# Patient Record
Sex: Female | Born: 1950 | Hispanic: Yes | Smoking: Former smoker
Health system: Southern US, Community
[De-identification: ages and names within clinical notes are randomized; demographics above are authoritative.]

## PROBLEM LIST (undated history)

## (undated) DIAGNOSIS — I10 Essential (primary) hypertension: Secondary | ICD-10-CM

## (undated) DIAGNOSIS — E079 Disorder of thyroid, unspecified: Secondary | ICD-10-CM

## (undated) HISTORY — PX: BREAST CYST ASPIRATION: SHX578

## (undated) HISTORY — PX: TONSILLECTOMY: SUR1361

## (undated) HISTORY — PX: APPENDECTOMY: SHX54

---

## 2018-06-09 ENCOUNTER — Emergency Department
Admission: EM | Admit: 2018-06-09 | Discharge: 2018-06-09 | Disposition: A | Discharge status: Home / Self Care | Payer: Self-pay | Source: Home / Self Care | Attending: Family Medicine | Admitting: Family Medicine

## 2018-06-09 ENCOUNTER — Other Ambulatory Visit: Payer: Self-pay

## 2018-06-09 ENCOUNTER — Encounter: Payer: Self-pay | Admitting: *Deleted

## 2018-06-09 DIAGNOSIS — E079 Disorder of thyroid, unspecified: Secondary | ICD-10-CM

## 2018-06-09 DIAGNOSIS — I10 Essential (primary) hypertension: Secondary | ICD-10-CM

## 2018-06-09 DIAGNOSIS — Z760 Encounter for issue of repeat prescription: Secondary | ICD-10-CM

## 2018-06-09 HISTORY — DX: Disorder of thyroid, unspecified: E07.9

## 2018-06-09 HISTORY — DX: Essential (primary) hypertension: I10

## 2018-06-09 LAB — POCT CBC W AUTO DIFF (K'VILLE URGENT CARE)

## 2018-06-09 MED ORDER — VALSARTAN-HYDROCHLOROTHIAZIDE 160-12.5 MG PO TABS: 1.0000 | ORAL_TABLET | Freq: Every day | ORAL | 0 refills | Status: DC

## 2018-06-09 MED ORDER — LEVOTHYROXINE SODIUM 125 MCG PO TABS: 125.0000 ug | ORAL_TABLET | Freq: Every day | ORAL | 0 refills | Status: DC

## 2018-06-09 NOTE — Discharge Instructions (Signed)
° °  Your thyroid and blood pressure medication were filled for 1 month as a courtesy in urgent care. Maintenance medication will not be refilled again as this urgent care. It is important to establish care with a primary care provider to ensure you are on the correct dose of medications and are not having any bad side effects from the prescriptions you are prescribed.   Please call to schedule an appointment with a primary care provider for ongoing healthcare needs including medication refills and monitoring of your thyroid disease and blood pressure.

## 2018-06-09 NOTE — ED Provider Notes (Signed)
[facility]    CSN: [id number] Arrival date & time: 06/09/18  1606     History   Chief Complaint Chief Complaint  Patient presents with  . Medication Refill    HPI Erica Russell is a 68 y.o. female.   HPI Erica Russell is a 68 y.o. female presenting to UC with hx of HTN and hypothyroidism requesting a one month refill of her medications. Pt moved to the area recently from [place] and does not have a PCP. Pt states she went to various Urgent Cares in [place].  She ran out of her thyroid medication about 1 week ago. She has started to feel fatigued and have eye pressure.  Denies chest pain, SOB, or palpitations. She notes she is on BP medication but the last provider wrote the prescription for medication that is double the dose she is normally on so she has been cutting her pills in half but states it is difficult to cut them equally.  She is suppose to be on valsartan-hydrochlorothiazide 120/12.5 but was written a prescription for 320/25mg  last time.   Past Medical History:  Diagnosis Date  . Hypertension   . Thyroid disease     There are no active problems to display for this patient.   Past Surgical History:  Procedure Laterality Date  . APPENDECTOMY    . TONSILLECTOMY      OB History   No obstetric history on file.      Home Medications    Prior to Admission medications   Medication Sig Start Date End Date Taking? Authorizing Provider  levothyroxine (SYNTHROID, LEVOTHROID) 125 MCG tablet Take 1 tablet (125 mcg total) by mouth daily. 06/09/18   Lurene Shadow, PA-C  valsartan-hydrochlorothiazide (DIOVAN HCT) 160-12.5 MG tablet Take 1 tablet by mouth daily. 06/09/18   Lurene Shadow, PA-C    Family History Family History  Problem Relation Age of Onset  . Hypertension Mother   . Diabetes Mother   . Hypertension Father     Social History Social History   Tobacco Use  . Smoking status: Former Games developer  . Smokeless tobacco: Never Used  Substance Use  Topics  . Alcohol use: Yes    Comment: occ  . Drug use: Never     Allergies   Patient has no known allergies.   Review of Systems Review of Systems  Constitutional: Positive for fatigue. Negative for unexpected weight change.  Respiratory: Negative for chest tightness and shortness of breath.   Cardiovascular: Negative for chest pain and palpitations.  Neurological: Positive for headaches (mild frontal). Negative for dizziness and light-headedness.     Physical Exam Triage Vital Signs ED Triage Vitals  Enc Vitals Group     BP 06/09/18 1645 (!) 144/99     Pulse Rate 06/09/18 1645 70     Resp 06/09/18 1645 16     Temp 06/09/18 1645 97.9 F (36.6 C)     Temp Source 06/09/18 1645 Oral     SpO2 06/09/18 1645 97 %     Weight 06/09/18 1647 183 lb (83 kg)     Height 06/09/18 1647 5\' 7"  (1.702 m)     Head Circumference --      Peak Flow --      Pain Score 06/09/18 1647 0     Pain Loc --      Pain Edu? --      Excl. in GC? --    No data found.  Updated Vital Signs BP Marland Kitchen)  144/99 (BP Location: Right Arm)   Pulse 70   Temp 97.9 F (36.6 C) (Oral)   Resp 16   Ht 5\' 7"  (1.702 m)   Wt 183 lb (83 kg)   SpO2 97%   BMI 28.66 kg/m   Visual Acuity Right Eye Distance:   Left Eye Distance:   Bilateral Distance:    Right Eye Near:   Left Eye Near:    Bilateral Near:     Physical Exam Vitals signs and nursing note reviewed.  Constitutional:      Appearance: Normal appearance. She is well-developed.  HENT:     Head: Normocephalic and atraumatic.     Nose: Nose normal.     Mouth/Throat:     Mouth: Mucous membranes are moist.  Eyes:     Pupils: Pupils are equal, round, and reactive to light.  Neck:     Musculoskeletal: Normal range of motion and neck supple.  Cardiovascular:     Rate and Rhythm: Normal rate and regular rhythm.  Pulmonary:     Effort: Pulmonary effort is normal.     Breath sounds: Normal breath sounds.  Musculoskeletal: Normal range of motion.    Skin:    General: Skin is warm and dry.  Neurological:     Mental Status: She is alert and oriented to person, place, and time.  Psychiatric:        Behavior: Behavior normal.      UC Treatments / Results  Labs (all labs ordered are listed, but only abnormal results are displayed) Labs Reviewed  COMPLETE METABOLIC PANEL WITH GFR  TSH  POCT CBC W AUTO DIFF ([facility])    EKG None  Radiology No results found.  Procedures Procedures (including critical care time)  Medications Ordered in UC Medications - No data to display  Initial Impression / Assessment and Plan / UC Course  I have reviewed the triage vital signs and the nursing notes.  Pertinent labs & imaging results that were available during my care of the patient were reviewed by me and considered in my medical decision making (see chart for details).     Pt appears well, NAD No records on file, will check her labs: CBC- unremarkable CMP and TSH- pending Will fill 1 month supply of maintenance medication. Resource guide for PCPs provided.  Final Clinical Impressions(s) / UC Diagnoses   Final diagnoses:  Encounter for medication refill  Thyroid disease  Essential hypertension, benign     Discharge Instructions       Your thyroid and blood pressure medication were filled for 1 month as a courtesy in urgent care. Maintenance medication will not be refilled again as this urgent care. It is important to establish care with a primary care provider to ensure you are on the correct dose of medications and are not having any bad side effects from the prescriptions you are prescribed.   Please call to schedule an appointment with a primary care provider for ongoing healthcare needs including medication refills and monitoring of your thyroid disease and blood pressure.    ED Prescriptions    Medication Sig Dispense Auth. Provider   levothyroxine (SYNTHROID, LEVOTHROID) 125 MCG tablet Take 1 tablet  (125 mcg total) by mouth daily. 30 tablet Doroteo GlassmanPhelps, Jodel Mayhall O, PA-C   valsartan-hydrochlorothiazide (DIOVAN HCT) 160-12.5 MG tablet Take 1 tablet by mouth daily. 30 tablet Lurene ShadowPhelps, Pang Robers O, PA-C     Controlled Substance Prescriptions  Controlled Substance Registry consulted? Not Applicable  Lurene Shadowhelps, Devyn Griffing O, [place]-C 06/09/18 2018

## 2018-06-09 NOTE — ED Triage Notes (Signed)
Pt is here today for a refill of her thyroid medication. She moved here recently and has not established a PCP.

## 2018-06-10 ENCOUNTER — Telehealth: Payer: Self-pay

## 2018-06-10 LAB — COMPLETE METABOLIC PANEL WITH GFR
AG Ratio: 1.8 (calc) (ref 1.0–2.5)
ALT: 26 U/L (ref 6–29)
AST: 19 U/L (ref 10–35)
Albumin: 4.6 g/dL (ref 3.6–5.1)
Alkaline phosphatase (APISO): 60 U/L (ref 33–130)
BUN/Creatinine Ratio: 20 (calc) (ref 6–22)
BUN: 22 mg/dL (ref 7–25)
CO2: 27 mmol/L (ref 20–32)
Calcium: 10.2 mg/dL (ref 8.6–10.4)
Chloride: 101 mmol/L (ref 98–110)
Creat: 1.1 mg/dL — ABNORMAL HIGH (ref 0.50–0.99)
GFR, Est African American: 61 mL/min/{1.73_m2} (ref >=60)
GFR, Est Non African American: 53 mL/min/{1.73_m2} — ABNORMAL LOW (ref >=60)
Globulin: 2.5 g/dL (calc) (ref 1.9–3.7)
Glucose, Bld: 92 mg/dL (ref 65–99)
Potassium: 3.9 mmol/L (ref 3.5–5.3)
Sodium: 137 mmol/L (ref 135–146)
Total Bilirubin: 0.4 mg/dL (ref 0.2–1.2)
Total Protein: 7.1 g/dL (ref 6.1–8.1)

## 2018-06-10 LAB — TSH: TSH: 6.51 mIU/L — ABNORMAL HIGH (ref 0.40–4.50)

## 2018-06-10 NOTE — Telephone Encounter (Signed)
Pt given lab results. Per Denny Peon, advised to call PCP to establish care and recheck TSH in about one week. Pt acknowledged.

## 2018-07-12 ENCOUNTER — Encounter: Payer: Self-pay | Admitting: Physician Assistant

## 2018-07-12 ENCOUNTER — Ambulatory Visit (INDEPENDENT_AMBULATORY_CARE_PROVIDER_SITE_OTHER): Payer: Self-pay | Admitting: Physician Assistant

## 2018-07-12 VITALS — BP 134/93 | HR 91 | Ht 67.0 in | Wt 184.0 lb

## 2018-07-12 DIAGNOSIS — Z1231 Encounter for screening mammogram for malignant neoplasm of breast: Secondary | ICD-10-CM

## 2018-07-12 DIAGNOSIS — Z1211 Encounter for screening for malignant neoplasm of colon: Secondary | ICD-10-CM

## 2018-07-12 DIAGNOSIS — I10 Essential (primary) hypertension: Secondary | ICD-10-CM

## 2018-07-12 DIAGNOSIS — Z7689 Persons encountering health services in other specified circumstances: Secondary | ICD-10-CM

## 2018-07-12 DIAGNOSIS — R944 Abnormal results of kidney function studies: Secondary | ICD-10-CM | POA: Insufficient documentation

## 2018-07-12 DIAGNOSIS — E039 Hypothyroidism, unspecified: Secondary | ICD-10-CM

## 2018-07-12 DIAGNOSIS — R7989 Other specified abnormal findings of blood chemistry: Secondary | ICD-10-CM

## 2018-07-12 DIAGNOSIS — R928 Other abnormal and inconclusive findings on diagnostic imaging of breast: Secondary | ICD-10-CM | POA: Insufficient documentation

## 2018-07-12 MED ORDER — LEVOTHYROXINE SODIUM 125 MCG PO TABS: 125.0000 ug | ORAL_TABLET | Freq: Every day | ORAL | 1 refills | Status: DC

## 2018-07-12 NOTE — Progress Notes (Signed)
HPI:                                                                Erica Russell is a 68 y.o. female who presents to [name] today to establish care  Recently relocated from [place] a few months ago.  Hypothyroidism: takes Levothyroxine 125 mcg daily without issues. Diagnosed in her 40's. Denies hx of goiter, nodules or hashimoto's.  Denies chest pain, heart palpitations, tremor, GI symptoms, or skin changes.  HTN: longstanding for approx 15-20 years. Taking Valsartan-HCTZ 160-12.5 daily. Compliant with medications. Does not check BP's at home.  Denies vision change, headache, chest pain with exertion, orthopnea, lightheadedness, syncope and edema. Risk factors include: postmenopausal  Depression screen [facility] 2/9 07/12/2018  Decreased Interest 0  Down, Depressed, Hopeless 0  PHQ - 2 Score 0    Past Medical History:  Diagnosis Date  . Hypertension   . Thyroid disease    Past Surgical History:  Procedure Laterality Date  . APPENDECTOMY    . TONSILLECTOMY     Social History   Tobacco Use  . Smoking status: Former Smoker    Packs/day: 1.00    Years: 1.00    Pack years: 1.00    Types: Cigarettes    Last attempt to quit: 06/12/1983    Years since quitting: 35.1  . Smokeless tobacco: Never Used  Substance Use Topics  . Alcohol use: Not Currently    Comment: occ   family history includes Dementia in her mother; Hypertension in her father and mother.    ROS: Review of Systems  All other systems reviewed and are negative.    Medications: Current Outpatient Medications  Medication Sig Dispense Refill  . levothyroxine (SYNTHROID, LEVOTHROID) 125 MCG tablet Take 1 tablet (125 mcg total) by mouth daily. 90 tablet 1  . valsartan-hydrochlorothiazide (DIOVAN HCT) 160-12.5 MG tablet Take 1 tablet by mouth daily. 30 tablet 0   No current facility-administered medications for this visit.    No Known  Allergies     Objective:  BP (!) 134/93   Pulse 91   Ht 5\' 7"  (1.702 m)   Wt 184 lb (83.5 kg)   BMI 28.82 kg/m  Gen:  alert, not ill-appearing, no distress, appropriate for age HEENT: head normocephalic without obvious abnormality, conjunctiva and cornea clear, trachea midline Pulm: Normal work of breathing, normal phonation Neuro: alert and oriented x 3, no tremor MSK: extremities atraumatic, normal gait and station Skin: intact, no rashes on exposed skin, no jaundice, no cyanosis Psych: well-groomed, cooperative, good eye contact, euthymic mood, affect mood-congruent, speech is articulate, and thought processes clear and goal-directed  Lab Results  Component Value Date   CREATININE 1.10 (H) 06/09/2018   BUN 22 06/09/2018   NA 137 06/09/2018   K 3.9 06/09/2018   CL 101 06/09/2018   CO2 27 06/09/2018   Lab Results  Component Value Date   TSH 6.51 (H) 06/09/2018     No results found for this or any previous visit (from the past 72 hour(s)). No results found.    Assessment and Plan: 68 y.o. female with   .Pallavi was seen today for establish care.  Diagnoses and all orders for this visit:  Encounter to  establish care -     Renal Profile with Estimated GFR  Elevated serum creatinine -     Renal Profile with Estimated GFR -     Urine Microalbumin w/creat. ratio  Decreased GFR -     Renal Profile with Estimated GFR -     Urine Microalbumin w/creat. ratio  Hypertension goal BP (blood pressure) < 130/80 -     Renal Profile with Estimated GFR -     Urine Microalbumin w/creat. ratio -     Urinalysis, Routine w reflex microscopic  Breast cancer screening by mammogram -     MM 3D SCREEN BREAST BILATERAL; Future  Acquired hypothyroidism -     levothyroxine (SYNTHROID, LEVOTHROID) 125 MCG tablet; Take 1 tablet (125 mcg total) by mouth daily.  Colon cancer screening Comments: FOBT ordered 07/12/2018    - Personally reviewed PMH, PSH, PFH, medications,  allergies, HM - Age-appropriate cancer screening: overdue for colonoscopy, FOBT ordered today; overdue for mammmogram, order placed today - Influenza, Pneumovax and Tdap declined - PHQ2 negative  HTN Personally reviewed labs from 06/14/18 Noted elevated Scr 1.10 and mildly decreased GFR of 53 Rechecking renal function today If unchanged, will refill Valsartan-HCTZ If worsened, will switch to Amlodipine   Acquired Hypothyroidism TSH >6 while off of her medication She has been halving her dose for the las tweek Refills provided today Will recheck TSH in 6-12 weeks  Patient education and anticipatory guidance given Patient agrees with treatment plan Follow-up in 3 months for CPE or sooner as needed if symptoms worsen or fail to improve  Levonne Hubert PA-C

## 2018-07-12 NOTE — Patient Instructions (Signed)
For your blood pressure: - Goal <130/80 (Ideally 120's/70's) - take your blood pressure medication in the morning (unless instructed differently) - monitor and log blood pressures at home - check around the same time each day in a relaxed setting - Limit salt to <2500 mg/day - Follow DASH (Dietary Approach to Stopping Hypertension) eating plan - Try to get at least 150 minutes of aerobic exercise per week - Aim to go on a brisk walk 30 minutes per day at least 5 days per week. If you're not active, gradually increase how long you walk by 5 minutes each week - limit alcohol: 2 standard drinks per day for men and 1 per day for women - avoid tobacco/nicotine products. Consider smoking cessation if you smoke - weight loss: 7% of current body weight can reduce your blood pressure by 5-10 points - follow-up at least every 6 months for your blood pressure. Follow-up sooner if your BP is not controlled   

## 2018-07-15 ENCOUNTER — Encounter: Payer: Self-pay | Admitting: Physician Assistant

## 2018-07-15 LAB — RENAL PROFILE WITH ESTIMATED GFR
ALBUMIN MSPROF: 4.4 g/dL (ref 3.6–5.1)
BUN: 18 mg/dL (ref 7–25)
CO2: 28 mmol/L (ref 20–32)
Calcium: 9.6 mg/dL (ref 8.6–10.4)
Chloride: 106 mmol/L (ref 98–110)
Creat: 0.99 mg/dL (ref 0.50–0.99)
GFR, Est African American: 69 mL/min/{1.73_m2} (ref >=60)
GFR, Est Non African American: 59 mL/min/{1.73_m2} — ABNORMAL LOW (ref >=60)
Glucose, Bld: 85 mg/dL (ref 65–99)
PHOSPHORUS: 3.9 mg/dL (ref 2.1–4.3)
POTASSIUM: 4.1 mmol/L (ref 3.5–5.3)
SODIUM: 141 mmol/L (ref 135–146)

## 2018-07-15 LAB — URINALYSIS, ROUTINE W REFLEX MICROSCOPIC
Bacteria, UA: NONE SEEN /HPF
Bilirubin Urine: NEGATIVE
Glucose, UA: NEGATIVE
Hgb urine dipstick: NEGATIVE
Hyaline Cast: NONE SEEN /LPF
Ketones, ur: NEGATIVE
NITRITE: NEGATIVE
PROTEIN: NEGATIVE
RBC / HPF: NONE SEEN /HPF (ref 0–2)
Specific Gravity, Urine: 1.025 (ref 1.001–1.03)
pH: 5.5 (ref 5.0–8.0)

## 2018-07-15 LAB — MICROALBUMIN / CREATININE URINE RATIO
CREATININE, URINE: 187 mg/dL (ref 20–275)
Microalb Creat Ratio: 3 mcg/mg creat (ref <30)
Microalb, Ur: 0.6 mg/dL

## 2018-07-16 MED ORDER — IRBESARTAN 150 MG PO TABS: 150.0000 mg | ORAL_TABLET | Freq: Every day | ORAL | 0 refills | Status: DC

## 2018-10-12 ENCOUNTER — Other Ambulatory Visit: Payer: Self-pay

## 2018-10-12 ENCOUNTER — Ambulatory Visit (INDEPENDENT_AMBULATORY_CARE_PROVIDER_SITE_OTHER): Payer: Self-pay | Admitting: Physician Assistant

## 2018-10-12 ENCOUNTER — Encounter: Payer: Self-pay | Admitting: Physician Assistant

## 2018-10-12 VITALS — BP 124/83 | HR 78 | Temp 97.6°F | Wt 189.0 lb

## 2018-10-12 DIAGNOSIS — Z5181 Encounter for therapeutic drug level monitoring: Secondary | ICD-10-CM

## 2018-10-12 DIAGNOSIS — Z0000 Encounter for general adult medical examination without abnormal findings: Secondary | ICD-10-CM

## 2018-10-12 DIAGNOSIS — Z130 Encounter for screening for diseases of the blood and blood-forming organs and certain disorders involving the immune mechanism: Secondary | ICD-10-CM

## 2018-10-12 DIAGNOSIS — E039 Hypothyroidism, unspecified: Secondary | ICD-10-CM

## 2018-10-12 DIAGNOSIS — Z1211 Encounter for screening for malignant neoplasm of colon: Secondary | ICD-10-CM

## 2018-10-12 DIAGNOSIS — Z13220 Encounter for screening for lipoid disorders: Secondary | ICD-10-CM

## 2018-10-12 DIAGNOSIS — Z13820 Encounter for screening for osteoporosis: Secondary | ICD-10-CM

## 2018-10-12 DIAGNOSIS — I10 Essential (primary) hypertension: Secondary | ICD-10-CM

## 2018-10-12 DIAGNOSIS — N289 Disorder of kidney and ureter, unspecified: Secondary | ICD-10-CM

## 2018-10-12 DIAGNOSIS — R944 Abnormal results of kidney function studies: Secondary | ICD-10-CM

## 2018-10-12 DIAGNOSIS — Z780 Asymptomatic menopausal state: Secondary | ICD-10-CM

## 2018-10-12 MED ORDER — IRBESARTAN 150 MG PO TABS: 150.0000 mg | ORAL_TABLET | Freq: Every day | ORAL | 1 refills | Status: DC

## 2018-10-12 MED ORDER — LEVOTHYROXINE SODIUM 125 MCG PO TABS: 125.0000 ug | ORAL_TABLET | Freq: Every day | ORAL | 1 refills | Status: DC

## 2018-10-12 NOTE — Progress Notes (Signed)
Error -EH/RMA  

## 2018-10-12 NOTE — Patient Instructions (Addendum)
Stool for Occult Blood Test Why am I having this test? Stool for occult blood, or fecal occult blood test (FOBT), is a test that is used to check for gastrointestinal (GI) bleeding, which may be a sign of colon cancer. This test can also detect small amounts of blood in your stool (feces) from other causes, such as ulcers, bleeding blood vessels, or hemorrhoids. This test may be done as part of an annual routine exam to screen for colorectal cancer. Screening is recommended for all adults starting at age 66 and continuing until age 46. Your health care provider may recommend screening at age 66. What is being tested? This test checks for blood in your stool. What kind of sample is taken?  A stool sample is required for this test. Your health care provider may collect the sample with a swab of the rectum. Or, you may be instructed to collect the sample in a container at home. If you are instructed to collect the sample at home, your health care provider will give you the instructions and supplies that you will need. How do I collect samples at home? You may be asked to collect stool samples at home. Follow instructions from your health care provider about how to collect the samples. When collecting a stool sample at home, make sure you:  Use supplies and instructions that you received from the lab.  Have a bowel movement directly into a clean, dry container. Do not collect stool from the water in the toilet.  Transfer the sample into the germ-free (sterile) cup that you received from the lab.  Do not let any toilet paper or urine get into the cup.  Wash your hands with soap and water after collecting the sample.  Return the samples to the lab as instructed. How do I prepare for this test?  Do not eat any red meat during the 3 days before your test.  Follow instructions from your health care provider about eating and drinking before the test. Your health care provider may instruct you to  avoid other foods or substances. Tell a health care provider about:  All medicines you are taking, including vitamins, herbs, eye drops, creams, and over-the-counter medicines. You may be instructed to avoid certain medicines that are known to interfere with this test.  Any recent dental procedures you have had. How are the results reported? Your test results will be reported as either positive or negative for blood in your stool. What do the results mean? A negative test result means that there is no blood within the stool. A negative result is considered normal. A positive test result may mean that there is blood in the stool. Causes of blood in the stool include:  GI tumors.  Certain GI diseases.  GI trauma or recent surgery.  Hemorrhoids. If your test result is positive, additional tests may be needed to find the source of the bleeding. Talk with your health care provider about what your results mean. Questions to ask your health care provider Ask your health care provider, or the department that is doing the test:  When will my results be ready?  How will I get my results?  What are my treatment options?  What other tests do I need?  What are my next steps? Summary  Stool for occult blood, or fecal occult blood test (FOBT), is a test that is used to check for gastrointestinal (GI) bleeding, which may be a sign of colon cancer.  This test  can also detect small amounts of blood in your stool (feces) from other causes, such as ulcers, bleeding blood vessels, or hemorrhoids.  Your health care provider may collect the sample with a swab of the rectum. Or, you may be instructed to collect the sample in a container at home.  A positive test result may mean that there is blood in the stool. This information is not intended to replace advice given to you by your health care provider. Make sure you discuss any questions you have with your health care provider. Document Released:  06/06/2004 Document Revised: 07/02/2017 Document Reviewed: 01/06/2017 Elsevier Interactive Patient Education  2019 [place] 65 Years and Older, Female Preventive care refers to lifestyle choices and visits with your health care provider that can promote health and wellness. What does preventive care include?  A yearly physical exam. This is also called an annual well check.  Dental exams once or twice a year.  Routine eye exams. Ask your health care provider how often you should have your eyes checked.  Personal lifestyle choices, including: ? Daily care of your teeth and gums. ? Regular physical activity. ? Eating a healthy diet. ? Avoiding tobacco and drug use. ? Limiting alcohol use. ? Practicing safe sex. ? Taking low-dose aspirin every day. ? Taking vitamin and mineral supplements as recommended by your health care provider. What happens during an annual well check? The services and screenings done by your health care provider during your annual well check will depend on your age, overall health, lifestyle risk factors, and family history of disease. Counseling Your health care provider may ask you questions about your:  Alcohol use.  Tobacco use.  Drug use.  Emotional well-being.  Home and relationship well-being.  Sexual activity.  Eating habits.  History of falls.  Memory and ability to understand (cognition).  Work and work Statistician.  Reproductive health.  Screening You may have the following tests or measurements:  Height, weight, and BMI.  Blood pressure.  Lipid and cholesterol levels. These may be checked every 5 years, or more frequently if you are over 66 years old.  Skin check.  Lung cancer screening. You may have this screening every year starting at age 10 if you have a 30-pack-year history of smoking and currently smoke or have quit within the past 15 years.  Colorectal cancer screening. All adults should have  this screening starting at age 66 and continuing until age 66. You will have tests every 1-10 years, depending on your results and the type of screening test. People at increased risk should start screening at an earlier age. Screening tests may include: ? Guaiac-based fecal occult blood testing. ? Fecal immunochemical test (FIT). ? Stool DNA test. ? Virtual colonoscopy. ? Sigmoidoscopy. During this test, a flexible tube with a tiny camera (sigmoidoscope) is used to examine your rectum and lower colon. The sigmoidoscope is inserted through your anus into your rectum and lower colon. ? Colonoscopy. During this test, a long, thin, flexible tube with a tiny camera (colonoscope) is used to examine your entire colon and rectum.  Hepatitis C blood test.  Hepatitis B blood test.  Sexually transmitted disease (STD) testing.  Diabetes screening. This is done by checking your blood sugar (glucose) after you have not eaten for a while (fasting). You may have this done every 1-3 years.  Bone density scan. This is done to screen for osteoporosis. You may have this done starting at age 90.  Mammogram. This may be done every 1-2 years. Talk to your health care provider about how often you should have regular mammograms. Talk with your health care provider about your test results, treatment options, and if necessary, the need for more tests. Vaccines Your health care provider may recommend certain vaccines, such as:  Influenza vaccine. This is recommended every year.  Tetanus, diphtheria, and acellular pertussis (Tdap, Td) vaccine. You may need a Td booster every 10 years.  Varicella vaccine. You may need this if you have not been vaccinated.  Zoster vaccine. You may need this after age 63.  Measles, mumps, and rubella (MMR) vaccine. You may need at least one dose of MMR if you were born in 1957 or later. You may also need a second dose.  Pneumococcal 13-valent conjugate (PCV13) vaccine. One dose is  recommended after age 47.  Pneumococcal polysaccharide (PPSV23) vaccine. One dose is recommended after age 70.  Meningococcal vaccine. You may need this if you have certain conditions.  Hepatitis A vaccine. You may need this if you have certain conditions or if you travel or work in places where you may be exposed to hepatitis A.  Hepatitis B vaccine. You may need this if you have certain conditions or if you travel or work in places where you may be exposed to hepatitis B.  Haemophilus influenzae type b (Hib) vaccine. You may need this if you have certain conditions. Talk to your health care provider about which screenings and vaccines you need and how often you need them. This information is not intended to replace advice given to you by your health care provider. Make sure you discuss any questions you have with your health care provider. Document Released: 06/08/2015 Document Revised: 07/02/2017 Document Reviewed: 03/13/2015 Elsevier Interactive Patient Education  2019 [employer].

## 2018-10-12 NOTE — Progress Notes (Signed)
Subjective:    Erica Russell is a 68 y.o. female who presents for Medicare Annual/Subsequent preventive examination.  Preventive Screening-Counseling & Management  Tobacco Social History   Tobacco Use  Smoking Status Former Smoker  . Packs/day: 1.00  . Years: 1.00  . Pack years: 1.00  . Types: Cigarettes  . Last attempt to quit: 06/12/1983  . Years since quitting: 35.3  Smokeless Tobacco Never Used     Current Problems (verified) Patient Active Problem List   Diagnosis Date Noted  . Renal insufficiency 10/12/2018  . Elevated serum creatinine 07/12/2018  . Decreased GFR 07/12/2018  . Hypertension goal BP (blood pressure) < 130/80 07/12/2018  . Breast cancer screening by mammogram 07/12/2018  . Acquired hypothyroidism 07/12/2018  . Colon cancer screening 07/12/2018    Medications Prior to Visit No current outpatient medications on file prior to visit.   No current facility-administered medications on file prior to visit.     Current Medications (verified) Current Outpatient Medications  Medication Sig Dispense Refill  . irbesartan (AVAPRO) 150 MG tablet Take 1 tablet (150 mg total) by mouth daily. 90 tablet 1  . levothyroxine (SYNTHROID) 125 MCG tablet Take 1 tablet (125 mcg total) by mouth daily. 90 tablet 1   No current facility-administered medications for this visit.      Allergies (verified) Hydrochlorothiazide   PAST HISTORY  Family History Family History  Problem Relation Age of Onset  . Hypertension Mother   . Dementia Mother   . Hypertension Father     Social History Social History   Tobacco Use  . Smoking status: Former Smoker    Packs/day: 1.00    Years: 1.00    Pack years: 1.00    Types: Cigarettes    Last attempt to quit: 06/12/1983    Years since quitting: 35.3  . Smokeless tobacco: Never Used  Substance Use Topics  . Alcohol use: Not Currently    Comment: occ     Are there smokers in your home (other than you)? No  Risk  Factors Current exercise habits: Home exercise routine includes calisthenics and walking 2-3 days per week.  Dietary issues discussed: n/a   Cardiac risk factors: advanced age (older than 2255 for men, 4965 for women) and hypertension.  Depression Screen [facility] 10/12/2018 07/12/2018  Decreased Interest 0 0  Down, Depressed, Hopeless 0 0  PHQ - 2 Score 0 0     Activities of Daily Living In your present state of health, do you have any difficulty performing the following activities?:  Driving? No Managing money?  No Feeding yourself? No Getting from bed to chair? No Climbing a flight of stairs? No Preparing food and eating?: No Bathing or showering? No Getting dressed: No Getting to the toilet? No Using the toilet:No Moving around from place to place: No In the past year have you fallen or had a near fall?:No   Are you sexually active?  No  Do you have more than one partner?  No  Hearing Difficulties: No Do you often ask people to speak up or repeat themselves? No Do you experience ringing or noises in your ears? No Do you have difficulty understanding soft or whispered voices? No   Do you feel that you have a problem with memory? No  Do you often misplace items? No  Do you feel safe at home?  Yes  Cognitive Testing 6CIT Screen 10/12/2018  What Year? 0 points  What month? 0 points  What time?  0 points  Count back from 20 0 points  Months in reverse 0 points  Repeat phrase 6 points  Total Score 6       Advanced Directives have been discussed with the patient? Yes  List the Names of Other Physician/Practitioners you currently use: N/a  Indicate any recent Medical Services you may have received from other than [facility] providers in the past year (date may be approximate).   There is no immunization history on file for this patient.  Screening Tests Health Maintenance  Topic Date Due  . MAMMOGRAM  06/11/2002  . COLON CANCER SCREENING ANNUAL FOBT   06/11/2002  . DEXA SCAN  06/11/2017  . COLONOSCOPY  07/13/2019 (Originally 06/11/2002)  . TETANUS/TDAP  07/13/2019 (Originally 06/12/1971)  . Hepatitis C Screening  07/13/2019 (Originally 02-24-1951)  . PNA vac Low Risk Adult (1 of 2 - PCV13) 07/13/2019 (Originally 06/11/2017)  . INFLUENZA VACCINE  12/25/2018    All answers were reviewed with the patient and necessary referrals were made:  Carlis Stable, PA-C   10/12/2018   History reviewed: allergies, current medications, past family history, past medical history, past social history, past surgical history and problem list  Review of Systems A comprehensive review of systems was negative.    Objective:       Body mass index is 29.6 kg/m. BP 124/83   Pulse 78   Temp 97.6 F (36.4 C) (Oral)   Wt 189 lb (85.7 kg)   BMI 29.60 kg/m  Vitals:   10/12/18 1346 10/12/18 1413  BP: (!) 153/93 124/83  Pulse: 78   Temp: 97.6 F (36.4 C)     Gen:  alert, not ill-appearing, no distress, appropriate for age HEENT: head normocephalic without obvious abnormality, conjunctiva and cornea clear, trachea midline, thyroid without enlargement, tenderness or nodules; no cervical adenopathy Pulm: Normal work of breathing, normal phonation, clear to auscultation bilaterally, no wheezes, rales or rhonchi CV: Normal rate, regular rhythm, s1 and s2 distinct, no murmurs, clicks or rubs  Neuro: alert and oriented x 3, no tremor MSK: extremities atraumatic, normal gait and station, no peripheral edema Skin: intact, no rashes on exposed skin, no jaundice, no cyanosis Psych: well-groomed, cooperative, good eye contact, euthymic mood, affect mood-congruent, speech is articulate, and thought processes clear and goal-directed     BP Readings from Last 3 Encounters:  10/12/18 124/83  07/12/18 (!) 134/93  06/09/18 (!) 144/99   Wt Readings from Last 3 Encounters:  10/12/18 189 lb (85.7 kg)  07/12/18 184 lb (83.5 kg)  06/09/18 183 lb (83 kg)    Lab Results  Component Value Date   CREATININE 0.99 07/14/2018   BUN 18 07/14/2018   NA 141 07/14/2018   K 4.1 07/14/2018   CL 106 07/14/2018   CO2 28 07/14/2018   Lab Results  Component Value Date   ALT 26 06/09/2018   AST 19 06/09/2018   BILITOT 0.4 06/09/2018   Lab Results  Component Value Date   TSH 6.51 (H) 06/09/2018      Assessment:     .Haislee was seen today for medicare wellness.  Diagnoses and all orders for this visit:  Encounter for Medicare annual wellness exam -     TSH -     COMPLETE METABOLIC PANEL WITH GFR -     Lipid Panel w/reflex Direct LDL -     CBC  Acquired hypothyroidism -     levothyroxine (SYNTHROID) 125 MCG tablet; Take 1 tablet (125 mcg total)  by mouth daily. -     TSH  Hypertension goal BP (blood pressure) < 130/80 -     irbesartan (AVAPRO) 150 MG tablet; Take 1 tablet (150 mg total) by mouth daily. -     COMPLETE METABOLIC PANEL WITH GFR  Decreased GFR  Renal insufficiency -     COMPLETE METABOLIC PANEL WITH GFR  Encounter for medication monitoring -     TSH -     COMPLETE METABOLIC PANEL WITH GFR -     Lipid Panel w/reflex Direct LDL -     CBC  Screening for lipid disorders -     Lipid Panel w/reflex Direct LDL  Screening for blood disease -     CBC  Postmenopausal -     DG Bone Density; Future  Screening for osteoporosis -     DG Bone Density; Future  Colon cancer screening Comments: FOBT given again 10/12/18        Plan:     During the course of the visit the patient was educated and counseled about appropriate screening and preventive services including:    Screening mammography  Bone densitometry screening  Colorectal cancer screening  Advanced directives: has NO advanced directive  - add't info requested. Referral to SW: not applicable  Patient Instructions (the written plan) was given to the patient.  Medicare Attestation I have personally reviewed: The patient's medical and social  history Their use of alcohol, tobacco or illicit drugs Their current medications and supplements The patient's functional ability including ADLs,fall risks, home safety risks, cognitive, and hearing and visual impairment Diet and physical activities Evidence for depression or mood disorders  The patient's weight, height, BMI,have been recorded in the chart.  I have made referrals, counseling, and provided education to the patient based on review of the above and I have provided the patient with a written personalized care plan for preventive services.     Carlis Stable, [place]   10/12/2018

## 2018-10-14 ENCOUNTER — Telehealth: Payer: Self-pay

## 2018-10-14 NOTE — Telephone Encounter (Signed)
Erica Russell advised to have labs rechecked in 6-8 weeks after taking the manufacturer change for Levothyroxine. She agreed.

## 2018-12-29 ENCOUNTER — Other Ambulatory Visit: Payer: Self-pay

## 2018-12-29 ENCOUNTER — Ambulatory Visit: Payer: Self-pay

## 2019-01-12 ENCOUNTER — Other Ambulatory Visit: Payer: Self-pay

## 2019-01-12 ENCOUNTER — Ambulatory Visit: Payer: Self-pay | Admitting: Physician Assistant

## 2019-01-12 ENCOUNTER — Ambulatory Visit (INDEPENDENT_AMBULATORY_CARE_PROVIDER_SITE_OTHER): Payer: Medicare Other

## 2019-01-12 DIAGNOSIS — Z1231 Encounter for screening mammogram for malignant neoplasm of breast: Secondary | ICD-10-CM

## 2019-01-12 DIAGNOSIS — Z780 Asymptomatic menopausal state: Secondary | ICD-10-CM | POA: Diagnosis not present

## 2019-01-12 DIAGNOSIS — Z13820 Encounter for screening for osteoporosis: Secondary | ICD-10-CM | POA: Diagnosis not present

## 2019-01-13 ENCOUNTER — Ambulatory Visit: Payer: Self-pay | Admitting: Physician Assistant

## 2019-01-13 LAB — LIPID PANEL W/REFLEX DIRECT LDL
Cholesterol: 216 mg/dL — ABNORMAL HIGH (ref <200)
HDL: 53 mg/dL (ref >=50)
LDL Cholesterol (Calc): 136 mg/dL (calc) — ABNORMAL HIGH
Non-HDL Cholesterol (Calc): 163 mg/dL (calc) — ABNORMAL HIGH (ref <130)
Total CHOL/HDL Ratio: 4.1 (calc) (ref <5.0)
Triglycerides: 148 mg/dL (ref <150)

## 2019-01-13 LAB — COMPLETE METABOLIC PANEL WITH GFR
AG Ratio: 1.6 (calc) (ref 1.0–2.5)
ALT: 29 U/L (ref 6–29)
AST: 22 U/L (ref 10–35)
Albumin: 4.3 g/dL (ref 3.6–5.1)
Alkaline phosphatase (APISO): 73 U/L (ref 37–153)
BUN: 15 mg/dL (ref 7–25)
CO2: 26 mmol/L (ref 20–32)
Calcium: 9.8 mg/dL (ref 8.6–10.4)
Chloride: 105 mmol/L (ref 98–110)
Creat: 0.86 mg/dL (ref 0.50–0.99)
GFR, Est African American: 82 mL/min/{1.73_m2} (ref >=60)
GFR, Est Non African American: 70 mL/min/{1.73_m2} (ref >=60)
Globulin: 2.7 g/dL (calc) (ref 1.9–3.7)
Glucose, Bld: 105 mg/dL — ABNORMAL HIGH (ref 65–99)
Potassium: 4.7 mmol/L (ref 3.5–5.3)
Sodium: 140 mmol/L (ref 135–146)
Total Bilirubin: 0.5 mg/dL (ref 0.2–1.2)
Total Protein: 7 g/dL (ref 6.1–8.1)

## 2019-01-13 LAB — CBC
HCT: 43.6 % (ref 35.0–45.0)
Hemoglobin: 13.9 g/dL (ref 11.7–15.5)
MCH: 24.8 pg — ABNORMAL LOW (ref 27.0–33.0)
MCHC: 31.9 g/dL — ABNORMAL LOW (ref 32.0–36.0)
MCV: 77.7 fL — ABNORMAL LOW (ref 80.0–100.0)
MPV: 10.9 fL (ref 7.5–12.5)
Platelets: 284 10*3/uL (ref 140–400)
RBC: 5.61 10*6/uL — ABNORMAL HIGH (ref 3.80–5.10)
RDW: 13.4 % (ref 11.0–15.0)
WBC: 5.9 10*3/uL (ref 3.8–10.8)

## 2019-01-13 LAB — TSH: TSH: 0.35 mIU/L — ABNORMAL LOW (ref 0.40–4.50)

## 2019-01-17 ENCOUNTER — Ambulatory Visit (INDEPENDENT_AMBULATORY_CARE_PROVIDER_SITE_OTHER): Payer: Medicare Other | Admitting: Physician Assistant

## 2019-01-17 ENCOUNTER — Other Ambulatory Visit: Payer: Self-pay | Admitting: Physician Assistant

## 2019-01-17 ENCOUNTER — Encounter: Payer: Self-pay | Admitting: Physician Assistant

## 2019-01-17 ENCOUNTER — Other Ambulatory Visit: Payer: Self-pay

## 2019-01-17 VITALS — BP 126/90 | HR 87 | Temp 97.9°F | Wt 189.0 lb

## 2019-01-17 DIAGNOSIS — I10 Essential (primary) hypertension: Secondary | ICD-10-CM | POA: Diagnosis not present

## 2019-01-17 DIAGNOSIS — E789 Disorder of lipoprotein metabolism, unspecified: Secondary | ICD-10-CM | POA: Diagnosis not present

## 2019-01-17 DIAGNOSIS — Z5320 Procedure and treatment not carried out because of patient's decision for unspecified reasons: Secondary | ICD-10-CM | POA: Diagnosis not present

## 2019-01-17 DIAGNOSIS — E039 Hypothyroidism, unspecified: Secondary | ICD-10-CM

## 2019-01-17 MED ORDER — LEVOTHYROXINE SODIUM 112 MCG PO TABS: 112.0000 ug | ORAL_TABLET | Freq: Every day | ORAL | 0 refills | Status: DC

## 2019-01-17 MED ORDER — ASPIRIN EC 81 MG PO TBEC: 81.0000 mg | DELAYED_RELEASE_TABLET | Freq: Every day | ORAL | 3 refills | Status: DC

## 2019-01-17 NOTE — Patient Instructions (Addendum)
For your blood pressure: - Goal <130/80 (Ideally 120's/70's) - take your blood pressure medication in the morning (unless instructed differently) - take baby aspirin 81 mg daily to help prevent heart attack/stroke - monitor and log blood pressures at home - check around the same time each day in a relaxed setting - Limit salt to <2500 mg/day - Follow DASH (Dietary Approach to Stopping Hypertension) eating plan - Try to get at least 150 minutes of aerobic exercise per week - Aim to go on a brisk walk 30 minutes per day at least 5 days per week. If you're not active, gradually increase how long you walk by 5 minutes each week - limit alcohol: 2 standard drinks per day for men and 1 per day for women - avoid tobacco/nicotine products. Consider smoking cessation if you smoke - weight loss: 7% of current body weight can reduce your blood pressure by 5-10 points - follow-up at least every 6 months for your blood pressure. Follow-up sooner if your BP is not controlled   DASH Eating Plan DASH stands for "Dietary Approaches to Stop Hypertension." The DASH eating plan is a healthy eating plan that has been shown to reduce high blood pressure (hypertension). It may also reduce your risk for type 2 diabetes, heart disease, and stroke. The DASH eating plan may also help with weight loss. What are tips for following this plan?  General guidelines  Avoid eating more than 2,300 mg (milligrams) of salt (sodium) a day. If you have hypertension, you may need to reduce your sodium intake to 1,500 mg a day.  Limit alcohol intake to no more than 1 drink a day for nonpregnant women and 2 drinks a day for men. One drink equals 12 oz of beer, 5 oz of wine, or 1 oz of hard liquor.  Work with your health care provider to maintain a healthy body weight or to lose weight. Ask what an ideal weight is for you.  Get at least 30 minutes of exercise that causes your heart to beat faster (aerobic exercise) most days of  the week. Activities may include walking, swimming, or biking.  Work with your health care provider or diet and nutrition specialist (dietitian) to adjust your eating plan to your individual calorie needs. Reading food labels   Check food labels for the amount of sodium per serving. Choose foods with less than 5 percent of the Daily Value of sodium. Generally, foods with less than 300 mg of sodium per serving fit into this eating plan.  To find whole grains, look for the word "whole" as the first word in the ingredient list. Shopping  Buy products labeled as "low-sodium" or "no salt added."  Buy fresh foods. Avoid canned foods and premade or frozen meals. Cooking  Avoid adding salt when cooking. Use salt-free seasonings or herbs instead of table salt or sea salt. Check with your health care provider or pharmacist before using salt substitutes.  Do not fry foods. Cook foods using healthy methods such as baking, boiling, grilling, and broiling instead.  Cook with heart-healthy oils, such as olive, canola, soybean, or sunflower oil. Meal planning  Eat a balanced diet that includes: ? 5 or more servings of fruits and vegetables each day. At each meal, try to fill half of your plate with fruits and vegetables. ? Up to 6-8 servings of whole grains each day. ? Less than 6 oz of lean meat, poultry, or fish each day. A 3-oz serving of meat is about   the same size as a deck of cards. One egg equals 1 oz. ? 2 servings of low-fat dairy each day. ? A serving of nuts, seeds, or beans 5 times each week. ? Heart-healthy fats. Healthy fats called Omega-3 fatty acids are found in foods such as flaxseeds and coldwater fish, like sardines, salmon, and mackerel.  Limit how much you eat of the following: ? Canned or prepackaged foods. ? Food that is high in trans fat, such as fried foods. ? Food that is high in saturated fat, such as fatty meat. ? Sweets, desserts, sugary drinks, and other foods with  added sugar. ? Full-fat dairy products.  Do not salt foods before eating.  Try to eat at least 2 vegetarian meals each week.  Eat more home-cooked food and less restaurant, buffet, and fast food.  When eating at a restaurant, ask that your food be prepared with less salt or no salt, if possible. What foods are recommended? The items listed may not be a complete list. Talk with your dietitian about what dietary choices are best for you. Grains Whole-grain or whole-wheat bread. Whole-grain or whole-wheat pasta. Brown rice. Oatmeal. Quinoa. Bulgur. Whole-grain and low-sodium cereals. Pita bread. Low-fat, low-sodium crackers. Whole-wheat flour tortillas. Vegetables Fresh or frozen vegetables (raw, steamed, roasted, or grilled). Low-sodium or reduced-sodium tomato and vegetable juice. Low-sodium or reduced-sodium tomato sauce and tomato paste. Low-sodium or reduced-sodium canned vegetables. Fruits All fresh, dried, or frozen fruit. Canned fruit in natural juice (without added sugar). Meat and other protein foods Skinless chicken or turkey. Ground chicken or turkey. Pork with fat trimmed off. Fish and seafood. Egg whites. Dried beans, peas, or lentils. Unsalted nuts, nut butters, and seeds. Unsalted canned beans. Lean cuts of beef with fat trimmed off. Low-sodium, lean deli meat. Dairy Low-fat (1%) or fat-free (skim) milk. Fat-free, low-fat, or reduced-fat cheeses. Nonfat, low-sodium ricotta or cottage cheese. Low-fat or nonfat yogurt. Low-fat, low-sodium cheese. Fats and oils Soft margarine without trans fats. Vegetable oil. Low-fat, reduced-fat, or light mayonnaise and salad dressings (reduced-sodium). Canola, safflower, olive, soybean, and sunflower oils. Avocado. Seasoning and other foods Herbs. Spices. Seasoning mixes without salt. Unsalted popcorn and pretzels. Fat-free sweets. What foods are not recommended? The items listed may not be a complete list. Talk with your dietitian about what  dietary choices are best for you. Grains Baked goods made with fat, such as croissants, muffins, or some breads. Dry pasta or rice meal packs. Vegetables Creamed or fried vegetables. Vegetables in a cheese sauce. Regular canned vegetables (not low-sodium or reduced-sodium). Regular canned tomato sauce and paste (not low-sodium or reduced-sodium). Regular tomato and vegetable juice (not low-sodium or reduced-sodium). Pickles. Olives. Fruits Canned fruit in a light or heavy syrup. Fried fruit. Fruit in cream or butter sauce. Meat and other protein foods Fatty cuts of meat. Ribs. Fried meat. Bacon. Sausage. Bologna and other processed lunch meats. Salami. Fatback. Hotdogs. Bratwurst. Salted nuts and seeds. Canned beans with added salt. Canned or smoked fish. Whole eggs or egg yolks. Chicken or turkey with skin. Dairy Whole or 2% milk, cream, and half-and-half. Whole or full-fat cream cheese. Whole-fat or sweetened yogurt. Full-fat cheese. Nondairy creamers. Whipped toppings. Processed cheese and cheese spreads. Fats and oils Butter. Stick margarine. Lard. Shortening. Ghee. Bacon fat. Tropical oils, such as coconut, palm kernel, or palm oil. Seasoning and other foods Salted popcorn and pretzels. Onion salt, garlic salt, seasoned salt, table salt, and sea salt. Worcestershire sauce. Tartar sauce. Barbecue sauce. Teriyaki sauce. Soy sauce,   including reduced-sodium. Steak sauce. Canned and packaged gravies. Fish sauce. Oyster sauce. Cocktail sauce. Horseradish that you find on the shelf. Ketchup. Mustard. Meat flavorings and tenderizers. Bouillon cubes. Hot sauce and Tabasco sauce. Premade or packaged marinades. Premade or packaged taco seasonings. Relishes. Regular salad dressings. Where to find more information:  National Heart, Lung, and Blood Institute: www.nhlbi.nih.gov  American Heart Association: www.heart.org Summary  The DASH eating plan is a healthy eating plan that has been shown to reduce  high blood pressure (hypertension). It may also reduce your risk for type 2 diabetes, heart disease, and stroke.  With the DASH eating plan, you should limit salt (sodium) intake to 2,300 mg a day. If you have hypertension, you may need to reduce your sodium intake to 1,500 mg a day.  When on the DASH eating plan, aim to eat more fresh fruits and vegetables, whole grains, lean proteins, low-fat dairy, and heart-healthy fats.  Work with your health care provider or diet and nutrition specialist (dietitian) to adjust your eating plan to your individual calorie needs. This information is not intended to replace advice given to you by your health care provider. Make sure you discuss any questions you have with your health care provider. Document Released: 05/01/2011 Document Revised: 04/24/2017 Document Reviewed: 05/05/2016 Elsevier Patient Education  2020 [employer]  

## 2019-01-17 NOTE — Progress Notes (Signed)
HPI:                                                                Erica Russell is a 68 y.o. female who [facility] Erica Russell: Primary Care Sports Medicine today for HTN/hypothyroidism follow-up  Hypothyroidism: diagnosed in 4th decade. Denies hx of goiter, nodules or hashimoto's.Takes synthroid 125 mcg daily without issues. At last OV, her TSH was >6 and she had been rationing her medication. Denies chest pain, heart palpitations, tremor, GI symptoms, or skin changes.  HTN: longstanding for approx 15-20 years. taking Irbesartan 150 daily. Of note, she had myalgias with HCTZ. Compliant with medications. Does not check BP's at home.  Denies vision change, headache, chest pain with exertion, orthopnea, lightheadedness, syncope and edema. Risk factors include: postmenopausal, HLD    Past Medical History:  Diagnosis Date  . Hypertension   . Thyroid disease    Past Surgical History:  Procedure Laterality Date  . APPENDECTOMY    . BREAST CYST ASPIRATION    . TONSILLECTOMY     Social History   Tobacco Use  . Smoking status: Former Smoker    Packs/day: 1.00    Years: 1.00    Pack years: 1.00    Types: Cigarettes    Quit date: 06/12/1983    Years since quitting: 35.6  . Smokeless tobacco: Never Used  Substance Use Topics  . Alcohol use: Not Currently    Comment: occ   family history includes Dementia in her mother; Hypertension in her father and mother.    ROS: negative except as noted in the HPI  Medications: Current Outpatient Medications  Medication Sig Dispense Refill  . irbesartan (AVAPRO) 150 MG tablet Take 1 tablet (150 mg total) by mouth daily. 90 tablet 1  . levothyroxine (SYNTHROID) 112 MCG tablet Take 1 tablet (112 mcg total) by mouth daily before breakfast. 90 tablet 0  . aspirin EC 81 MG tablet Take 1 tablet (81 mg total) by mouth daily. 90 tablet 3   No current facility-administered medications for this visit.    Allergies  Allergen  Reactions  . Hydrochlorothiazide Other (See Comments)    myalgia       Objective:  BP 126/90   Pulse 87   Temp 97.9 F (36.6 C) (Oral)   Wt 189 lb (85.7 kg)   BMI 29.60 kg/m   Vitals:   01/17/19 1043 01/17/19 1047  BP: (!) 141/96 126/90  Pulse: 87   Temp: 97.9 F (36.6 C)     Wt Readings from Last 3 Encounters:  01/17/19 189 lb (85.7 kg)  10/12/18 189 lb (85.7 kg)  07/12/18 184 lb (83.5 kg)   Temp Readings from Last 3 Encounters:  01/17/19 97.9 F (36.6 C) (Oral)  10/12/18 97.6 F (36.4 C) (Oral)  06/09/18 97.9 F (36.6 C) (Oral)   BP Readings from Last 3 Encounters:  01/17/19 126/90  10/12/18 124/83  07/12/18 (!) 134/93   Pulse Readings from Last 3 Encounters:  01/17/19 87  10/12/18 78  07/12/18 91    Gen:  alert, not ill-appearing, no distress, appropriate for age HEENT: head normocephalic without obvious abnormality, conjunctiva and cornea clear, trachea midline Pulm: Normal work of breathing, normal phonation, clear to auscultation bilaterally, no wheezes, rales  or rhonchi CV: Normal rate, regular rhythm, s1 and s2 distinct, no murmurs, clicks or rubs  Neuro: alert and oriented x 3, no tremor MSK: extremities atraumatic, normal gait and station, no peripheral edema, distal pulses intact Skin: intact, no rashes on exposed skin, no jaundice, no cyanosis Psych: well-groomed, cooperative, good eye contact, euthymic mood, affect mood-congruent, speech is articulate, and thought processes clear and goal-directed    Recent Results (from the past 2160 hour(s))  TSH     Status: Abnormal   Collection Time: 01/12/19  9:39 AM  Result Value Ref Range   TSH 0.35 (L) 0.40 - 4.50 mIU/L  COMPLETE METABOLIC PANEL WITH GFR     Status: Abnormal   Collection Time: 01/12/19  9:39 AM  Result Value Ref Range   Glucose, Bld 105 (H) 65 - 99 mg/dL    Comment: .            Fasting reference interval . For someone without known diabetes, a glucose value between 100 and  125 mg/dL is consistent with prediabetes and should be confirmed with a follow-up test. .    BUN 15 7 - 25 mg/dL   Creat 9.600.86 4.540.50 - 0.980.99 mg/dL    Comment: For patients >66 years of age, the reference limit for Creatinine is approximately 13% higher for people identified as African-American. .    GFR, Est Non African American 70 > OR = 60 mL/min/1.7773m2   GFR, Est African American 82 > OR = 60 mL/min/1.1273m2   BUN/Creatinine Ratio NOT APPLICABLE 6 - 22 (calc)   Sodium 140 135 - 146 mmol/L   Potassium 4.7 3.5 - 5.3 mmol/L   Chloride 105 98 - 110 mmol/L   CO2 26 20 - 32 mmol/L   Calcium 9.8 8.6 - 10.4 mg/dL   Total Protein 7.0 6.1 - 8.1 g/dL   Albumin 4.3 3.6 - 5.1 g/dL   Globulin 2.7 1.9 - 3.7 g/dL (calc)   AG Ratio 1.6 1.0 - 2.5 (calc)   Total Bilirubin 0.5 0.2 - 1.2 mg/dL   Alkaline phosphatase (APISO) 73 37 - 153 U/L   AST 22 10 - 35 U/L   ALT 29 6 - 29 U/L  Lipid Panel w/reflex Direct LDL     Status: Abnormal   Collection Time: 01/12/19  9:39 AM  Result Value Ref Range   Cholesterol 216 (H) <200 mg/dL   HDL 53 > OR = 50 mg/dL   Triglycerides 119148 <147<150 mg/dL   LDL Cholesterol (Calc) 136 (H) mg/dL (calc)    Comment: Reference range: <100 . Desirable range <100 mg/dL for primary prevention;   <70 mg/dL for patients with CHD or diabetic patients  with > or = 2 CHD risk factors. Marland Kitchen. LDL-C is now calculated using the Martin-Hopkins  calculation, which is a validated novel method providing  better accuracy than the Friedewald equation in the  estimation of LDL-C.  Horald PollenMartin SS et al. Lenox AhrJAMA. 8295;621(302013;310(19): 2061-2068  (http://education.[facility].com/faq/FAQ164)    Total CHOL/HDL Ratio 4.1 <5.0 (calc)   Non-HDL Cholesterol (Calc) 163 (H) <130 mg/dL (calc)    Comment: For patients with diabetes plus 1 major ASCVD risk  factor, treating to a non-HDL-C goal of <100 mg/dL  (LDL-C of <86<70 mg/dL) is considered a therapeutic  option.   CBC     Status: Abnormal   Collection Time:  01/12/19  9:39 AM  Result Value Ref Range   WBC 5.9 3.8 - 10.8 Thousand/uL   RBC 5.61 (H) 3.80 -  5.10 Million/uL   Hemoglobin 13.9 11.7 - 15.5 g/dL   HCT 43.6 35.0 - 45.0 %   MCV 77.7 (L) 80.0 - 100.0 fL   MCH 24.8 (L) 27.0 - 33.0 pg   MCHC 31.9 (L) 32.0 - 36.0 g/dL   RDW 13.4 11.0 - 15.0 %   Platelets 284 140 - 400 Thousand/uL   MPV 10.9 7.5 - 12.5 fL   The 10-year ASCVD risk score Mikey Bussing DC Jr., et al., 2013) is: 8.4%   Values used to calculate the score:     Age: 40 years     Sex: Female     Is Non-Hispanic African American: No     Diabetic: No     Tobacco smoker: No     Systolic Blood Pressure: 147 mmHg     Is BP treated: Yes     HDL Cholesterol: 53 mg/dL     Total Cholesterol: 216 mg/dL    Assessment and Plan: 68 y.o. female with   .Patrice was seen today for hypertension.  Diagnoses and all orders for this visit:  Hypertension goal BP (blood pressure) < 130/80 -     aspirin EC 81 MG tablet; Take 1 tablet (81 mg total) by mouth daily.  Acquired hypothyroidism -     TSH  Statin declined  Borderline high serum cholesterol    HTN BP Readings from Last 3 Encounters:  01/17/19 126/90  10/12/18 124/83  07/12/18 (!) 134/93  BP goal <130/80 Cont Irbesartan 150 mg QD 10 yr ASCVD risk 8.4% Baby asa for primary prevention Discussed statin therapy, reviewed risks and benefits. Statin declined Counseled on home BP monitoring. She is unsure if her home machines are accurate and will schedule a nurse visit Counseled on therapeutic lifestyle changes including DASH eating plan  Acquired hypothyroidism Lab Results  Component Value Date   TSH 0.35 (L) 01/12/2019  TSH over-suppressed Reduce Levothyroxine to 112 mcg Recheck TSH in 2 months  Patient education and anticipatory guidance given Patient agrees with treatment plan Follow-up in 1 week for nurse visit, then 6 months for medication management or sooner as needed if symptoms worsen or fail to  improve  Darlyne Russian PA-C

## 2019-01-20 ENCOUNTER — Ambulatory Visit: Payer: Self-pay

## 2019-01-24 ENCOUNTER — Other Ambulatory Visit: Payer: Self-pay | Admitting: Physician Assistant

## 2019-01-24 DIAGNOSIS — R928 Other abnormal and inconclusive findings on diagnostic imaging of breast: Secondary | ICD-10-CM

## 2019-01-28 ENCOUNTER — Other Ambulatory Visit: Payer: Self-pay

## 2019-01-28 ENCOUNTER — Ambulatory Visit
Admission: RE | Admit: 2019-01-28 | Discharge: 2019-01-28 | Disposition: A | Discharge status: Home / Self Care | Payer: Medicare Other | Source: Ambulatory Visit | Attending: Physician Assistant | Admitting: Physician Assistant

## 2019-01-28 ENCOUNTER — Encounter: Payer: Self-pay | Admitting: Physician Assistant

## 2019-01-28 DIAGNOSIS — R921 Mammographic calcification found on diagnostic imaging of breast: Secondary | ICD-10-CM | POA: Insufficient documentation

## 2019-01-28 DIAGNOSIS — R928 Other abnormal and inconclusive findings on diagnostic imaging of breast: Secondary | ICD-10-CM

## 2019-04-13 ENCOUNTER — Other Ambulatory Visit: Payer: Self-pay | Admitting: Physician Assistant

## 2019-04-13 DIAGNOSIS — E039 Hypothyroidism, unspecified: Secondary | ICD-10-CM

## 2019-04-14 ENCOUNTER — Other Ambulatory Visit: Payer: Self-pay | Admitting: Physician Assistant

## 2019-04-14 DIAGNOSIS — I10 Essential (primary) hypertension: Secondary | ICD-10-CM

## 2019-04-14 MED ORDER — LEVOTHYROXINE SODIUM 112 MCG PO TABS: 112.0000 ug | ORAL_TABLET | Freq: Every day | ORAL | 0 refills | Status: DC

## 2019-04-14 NOTE — Telephone Encounter (Signed)
Patient needs lab work. 30 day sent.

## 2019-05-11 ENCOUNTER — Other Ambulatory Visit: Payer: Self-pay | Admitting: Osteopathic Medicine

## 2019-05-11 DIAGNOSIS — E039 Hypothyroidism, unspecified: Secondary | ICD-10-CM

## 2019-05-11 NOTE — Telephone Encounter (Signed)
Requested medication (s) are due for refill today:yes  Requested medication (s) are on the active medication list: yes  Last refill:  04/14/2019  Future visit scheduled: no  Notes to clinic: Due for labs     Requested Prescriptions  Pending Prescriptions Disp Refills   EUTHYROX 112 MCG tablet [Pharmacy Med Name: Euthyrox 112 MCG Oral Tablet] 30 tablet 0    Sig: TAKE 1 TABLET BY MOUTH ONCE DAILY BEFORE BREAKFAST. NEEDS LAB WORK.      Endocrinology:  Hypothyroid Agents Failed - 05/11/2019 11:06 AM      Failed - TSH needs to be rechecked within 3 months after an abnormal result. Refill until TSH is due.      Failed - TSH in normal range and within 360 days    TSH  Date Value Ref Range Status  01/12/2019 0.35 (L) 0.40 - 4.50 mIU/L Final          Failed - Valid encounter within last 12 months    Recent Outpatient Visits           3 months ago Hypertension goal BP (blood pressure) < 130/80   [facility], Elson Areas, PA-C   7 months ago Encounter for [employer] annual wellness exam   [place], Elson Areas, PA-C   10 months ago Encounter to establish care   [place], [place], [place]

## 2019-05-13 ENCOUNTER — Other Ambulatory Visit: Payer: Self-pay | Admitting: Osteopathic Medicine

## 2019-05-13 DIAGNOSIS — E039 Hypothyroidism, unspecified: Secondary | ICD-10-CM

## 2019-05-13 LAB — TSH: TSH: 1.83 mIU/L (ref 0.40–4.50)

## 2019-05-13 MED ORDER — LEVOTHYROXINE SODIUM 112 MCG PO TABS: 112.0000 ug | ORAL_TABLET | Freq: Every day | ORAL | 0 refills | Status: DC

## 2019-05-27 ENCOUNTER — Other Ambulatory Visit: Payer: Self-pay | Admitting: Osteopathic Medicine

## 2019-05-27 DIAGNOSIS — E039 Hypothyroidism, unspecified: Secondary | ICD-10-CM

## 2019-05-29 ENCOUNTER — Other Ambulatory Visit: Payer: Self-pay | Admitting: Osteopathic Medicine

## 2019-05-29 DIAGNOSIS — E039 Hypothyroidism, unspecified: Secondary | ICD-10-CM

## 2019-05-30 MED ORDER — LEVOTHYROXINE SODIUM 112 MCG PO TABS: 112.0000 ug | ORAL_TABLET | Freq: Every day | ORAL | 1 refills | Status: DC

## 2019-05-31 ENCOUNTER — Telehealth: Payer: Self-pay | Admitting: *Deleted

## 2019-05-31 DIAGNOSIS — E039 Hypothyroidism, unspecified: Secondary | ICD-10-CM

## 2019-05-31 NOTE — Telephone Encounter (Signed)
Ok to change brands 

## 2019-05-31 NOTE — Telephone Encounter (Signed)
lvm advising pt that she will need to have her TSH rechecked in 6 weeks after switching to the new manufacturer for the levothyroxine to ensure that this doesn't throw her thyroid levels off. Advised her to rtn call should she have any questions. Lab faxed.Heath Gold, CMA

## 2019-05-31 NOTE — Telephone Encounter (Signed)
Pt's pharmacy sent request for authorization to change manufacturer due to the current manufacturer is no longer making it.  This is for the Levothyroxine 112 mcg #90.

## 2019-07-17 ENCOUNTER — Other Ambulatory Visit: Payer: Self-pay | Admitting: Physician Assistant

## 2019-07-17 DIAGNOSIS — I10 Essential (primary) hypertension: Secondary | ICD-10-CM

## 2019-07-19 ENCOUNTER — Other Ambulatory Visit: Payer: Self-pay | Admitting: Physician Assistant

## 2019-07-19 DIAGNOSIS — I10 Essential (primary) hypertension: Secondary | ICD-10-CM

## 2019-07-19 MED ORDER — IRBESARTAN 150 MG PO TABS: 150.0000 mg | ORAL_TABLET | Freq: Every day | ORAL | 0 refills | Status: DC

## 2019-08-18 ENCOUNTER — Other Ambulatory Visit: Payer: Self-pay | Admitting: Osteopathic Medicine

## 2019-08-18 DIAGNOSIS — I10 Essential (primary) hypertension: Secondary | ICD-10-CM

## 2019-08-19 MED ORDER — IRBESARTAN 150 MG PO TABS: 150.0000 mg | ORAL_TABLET | Freq: Every day | ORAL | 0 refills | Status: DC

## 2019-08-19 NOTE — Telephone Encounter (Signed)
Needs appointment

## 2019-08-29 ENCOUNTER — Ambulatory Visit: Payer: Medicare Other | Admitting: Medical-Surgical

## 2019-09-19 ENCOUNTER — Encounter: Payer: Self-pay | Admitting: Osteopathic Medicine

## 2019-09-19 ENCOUNTER — Ambulatory Visit (INDEPENDENT_AMBULATORY_CARE_PROVIDER_SITE_OTHER): Payer: Medicare Other | Admitting: Osteopathic Medicine

## 2019-09-19 ENCOUNTER — Other Ambulatory Visit: Payer: Self-pay

## 2019-09-19 VITALS — BP 122/82 | HR 76 | Ht 66.93 in | Wt 187.0 lb

## 2019-09-19 DIAGNOSIS — Z0000 Encounter for general adult medical examination without abnormal findings: Secondary | ICD-10-CM | POA: Diagnosis not present

## 2019-09-19 DIAGNOSIS — E039 Hypothyroidism, unspecified: Secondary | ICD-10-CM | POA: Diagnosis not present

## 2019-09-19 DIAGNOSIS — Z1211 Encounter for screening for malignant neoplasm of colon: Secondary | ICD-10-CM

## 2019-09-19 DIAGNOSIS — Z13220 Encounter for screening for lipoid disorders: Secondary | ICD-10-CM

## 2019-09-19 DIAGNOSIS — R944 Abnormal results of kidney function studies: Secondary | ICD-10-CM

## 2019-09-19 DIAGNOSIS — Z5320 Procedure and treatment not carried out because of patient's decision for unspecified reasons: Secondary | ICD-10-CM

## 2019-09-19 DIAGNOSIS — I10 Essential (primary) hypertension: Secondary | ICD-10-CM

## 2019-09-19 NOTE — Patient Instructions (Addendum)
General Preventive Care  Most recent routine screening labs: ordered.   Everyone should have blood pressure checked once per year. Goal 140/90 or less.   Tobacco: don't!   Alcohol: responsible moderation is ok for most adults - if you have concerns about your alcohol intake, please talk to me!   Exercise: as tolerated to reduce risk of cardiovascular disease and diabetes. Strength training will also prevent osteoporosis.   Mental health: if need for mental health care (medicines, counseling, other), or concerns about moods, please let me know!   Sexual health: if need for STD testing, or if concerns with libido/pain problems, please let me know! If you need to discuss your birth control options, please let me know!   Advanced Directive: Living Will and/or Healthcare Power of Attorney recommended for all adults, regardless of age or health.  Vaccines  Flu vaccine: for almost everyone, every fall.   Shingles vaccine: after age 70. --> pharmacy   Pneumonia vaccines: after age 59 --> here if you want it!   Tetanus booster: every 10 years --> pharmacy   COVID vaccine: thanks for getting your vaccine!  Cancer screenings   Colon cancer screening: for everyone age 75-75. Colonoscopy referral was placed.   Breast cancer screening: mammogram annually after age 56 until 56. Due 01/2020.    Cervical cancer screening: Pap usually stop at age 31 or w/ hysterectomy.   Lung cancer screening: not needed  Infection screenings  . HIV, Hep C, Gonorrhea/Chlamydia: screening as needed. . TB: certain at-risk populations, or depending on work requirements and/or travel history Other . Bone Density Test: recommended for women at age 80, repeat every other year - due 12/2020

## 2019-09-19 NOTE — Progress Notes (Signed)
HPI: Erica Russell is a 69 y.o. female who  has a past medical history of Hypertension and Thyroid disease.  she presents to [facility] today, 09/19/19,  for chief complaint of: HTN Thyroid Preventive / Annual check-up   HTN: BP elevated on intake, better on recheck.  Hypothyroid: needs TSH        Past medical, surgical, social and family history reviewed:  Patient Active Problem List   Diagnosis Date Noted  . Calcification of left breast on mammography 01/28/2019  . Borderline high serum cholesterol 01/17/2019  . Statin declined 01/17/2019  . Renal insufficiency 10/12/2018  . Elevated serum creatinine 07/12/2018  . Decreased GFR 07/12/2018  . Hypertension goal BP (blood pressure) < 130/80 07/12/2018  . Abnormal screening mammogram 07/12/2018  . Acquired hypothyroidism 07/12/2018  . Colon cancer screening 07/12/2018    Past Surgical History:  Procedure Laterality Date  . APPENDECTOMY    . BREAST CYST ASPIRATION    . TONSILLECTOMY      Social History   Tobacco Use  . Smoking status: Former Smoker    Packs/day: 1.00    Years: 1.00    Pack years: 1.00    Types: Cigarettes    Quit date: 06/12/1983    Years since quitting: 36.2  . Smokeless tobacco: Never Used  Substance Use Topics  . Alcohol use: Not Currently    Comment: occ    Family History  Problem Relation Age of Onset  . Hypertension Mother   . Dementia Mother   . Hypertension Father   . Breast cancer Sister        over 54  . Breast cancer Sister        over 28, hormone related     Current medication list and allergy/intolerance information reviewed:    Current Outpatient Medications  Medication Sig Dispense Refill  . aspirin EC 81 MG tablet Take 1 tablet (81 mg total) by mouth daily. 90 tablet 3  . irbesartan (AVAPRO) 150 MG tablet Take 1 tablet (150 mg total) by mouth daily. Needs follow up appointment 30 tablet 0  . levothyroxine (SYNTHROID) 112 MCG  tablet Take 1 tablet (112 mcg total) by mouth daily before breakfast. 90 tablet 1   No current facility-administered medications for this visit.    Allergies  Allergen Reactions  . Hydrochlorothiazide Other (See Comments)    myalgia      Review of Systems:  Constitutional:  No  fever, no chills, No recent illness  HEENT: No  headache, no vision change  Cardiac: No  chest pain, No  pressure, No palpitations  Respiratory:  No  shortness of breath. No  Cough  Gastrointestinal: No  abdominal pain, No  nausea, No  vomiting,  No  blood in stool, No  diarrhea, No  constipation   Musculoskeletal: No new myalgia/arthralgia  Skin: No  Rash, No other wounds/concerning lesions  Genitourinary: No  incontinence  Hem/Onc: No  easy bruising/bleeding, No  abnormal lymph node  Endocrine: No cold intolerance,  No heat intolerance  Neurologic: No  weakness, No  dizziness  Psychiatric: No  concerns with depression, No  concerns with anxiety, No sleep problems, No mood problems  Exam:  BP 122/82 (BP Location: Left Arm, Patient Position: Sitting, Cuff Size: Normal)   Pulse 76   Ht 5' 6.93" (1.7 m)   Wt 187 lb (84.8 kg)   SpO2 98%   BMI 29.35 kg/m   Constitutional: VS see above. General Appearance:  alert, well-developed, well-nourished, NAD  Eyes: Normal lids and conjunctive, non-icteric sclera  Neck: No masses, trachea midline. No thyroid enlargement. No tenderness/mass appreciated. No lymphadenopathy  Respiratory: Normal respiratory effort. no wheeze, no rhonchi, no rales  Cardiovascular: S1/S2 normal, no murmur, no rub/gallop auscultated. RRR. No lower extremity edema.  Gastrointestinal: Nontender, no masses. No hepatomegaly, no splenomegaly. No hernia appreciated. Bowel sounds normal. Rectal exam deferred.   Musculoskeletal: Gait normal. No clubbing/cyanosis of digits.   Neurological: Normal balance/coordination. No tremor. No cranial nerve deficit on limited exam. Motor and  sensation intact and symmetric. Cerebellar reflexes intact.   Skin: warm, dry, intact. No rash/ulcer. No concerning nevi or subq nodules on limited exam.    Psychiatric: Normal judgment/insight. Normal mood and affect. Oriented x3.    No results found for this or any previous visit (from the past 72 hour(s)).  No results found.      ASSESSMENT/PLAN: The primary encounter diagnosis was Hypertension goal BP (blood pressure) < 130/80. Diagnoses of Annual physical exam, Acquired hypothyroidism, Screening for lipid disorders, Decreased GFR, Statin declined, and Colon cancer screening were also pertinent to this visit.   Orders Placed This Encounter  Procedures  . CBC  . COMPLETE METABOLIC PANEL WITH GFR  . Lipid panel  . TSH  . Ambulatory referral to Gastroenterology    No orders of the defined types were placed in this encounter.   Patient Instructions  General Preventive Care  Most recent routine screening labs: ordered.   Everyone should have blood pressure checked once per year. Goal 140/90 or less.   Tobacco: don't!   Alcohol: responsible moderation is ok for most adults - if you have concerns about your alcohol intake, please talk to me!   Exercise: as tolerated to reduce risk of cardiovascular disease and diabetes. Strength training will also prevent osteoporosis.   Mental health: if need for mental health care (medicines, counseling, other), or concerns about moods, please let me know!   Sexual health: if need for STD testing, or if concerns with libido/pain problems, please let me know! If you need to discuss your birth control options, please let me know!   Advanced Directive: Living Will and/or Healthcare Power of Attorney recommended for all adults, regardless of age or health.  Vaccines  Flu vaccine: for almost everyone, every fall.   Shingles vaccine: after age 26. --> pharmacy   Pneumonia vaccines: after age 37 --> here if you want it!   Tetanus  booster: every 10 years --> pharmacy   COVID vaccine: thanks for getting your vaccine!  Cancer screenings   Colon cancer screening: for everyone age 41-75. Colonoscopy referral was placed.   Breast cancer screening: mammogram annually after age 55 until 96. Due 01/2020.    Cervical cancer screening: Pap usually stop at age 34 or w/ hysterectomy.   Lung cancer screening: not needed  Infection screenings  . HIV, Hep C, Gonorrhea/Chlamydia: screening as needed. . TB: certain at-risk populations, or depending on work requirements and/or travel history Other . Bone Density Test: recommended for women at age 54, repeat every other year - due 12/2020         Visit summary with medication list and pertinent instructions was printed for patient to review. All questions at time of visit were answered - patient instructed to contact office with any additional concerns or updates. ER/RTC precautions were reviewed with the patient.    . Please note: voice recognition software was used to produce this document, and  typos may escape review. Please contact Dr. Sheppard Coil for any needed clarifications.     Follow-up plan: Return in about 3 months (around 12/19/2019) for nurse visit - bring home BP machine. Otherwise yearly check-up with Dr Sheppard Coil in 1 year .

## 2019-09-20 ENCOUNTER — Encounter: Payer: Self-pay | Admitting: Osteopathic Medicine

## 2019-09-20 ENCOUNTER — Other Ambulatory Visit: Payer: Self-pay | Admitting: Osteopathic Medicine

## 2019-09-20 ENCOUNTER — Other Ambulatory Visit: Payer: Self-pay | Admitting: Physician Assistant

## 2019-09-20 DIAGNOSIS — E039 Hypothyroidism, unspecified: Secondary | ICD-10-CM

## 2019-09-20 DIAGNOSIS — I10 Essential (primary) hypertension: Secondary | ICD-10-CM

## 2019-09-20 LAB — COMPLETE METABOLIC PANEL WITH GFR
AG Ratio: 1.7 (calc) (ref 1.0–2.5)
ALT: 36 U/L — ABNORMAL HIGH (ref 6–29)
AST: 25 U/L (ref 10–35)
Albumin: 4.5 g/dL (ref 3.6–5.1)
Alkaline phosphatase (APISO): 70 U/L (ref 37–153)
BUN/Creatinine Ratio: 13 (calc) (ref 6–22)
BUN: 14 mg/dL (ref 7–25)
CO2: 24 mmol/L (ref 20–32)
Calcium: 9.7 mg/dL (ref 8.6–10.4)
Chloride: 106 mmol/L (ref 98–110)
Creat: 1.07 mg/dL — ABNORMAL HIGH (ref 0.50–0.99)
GFR, Est African American: 62 mL/min/{1.73_m2} (ref >=60)
GFR, Est Non African American: 54 mL/min/{1.73_m2} — ABNORMAL LOW (ref >=60)
Globulin: 2.6 g/dL (calc) (ref 1.9–3.7)
Glucose, Bld: 91 mg/dL (ref 65–99)
Potassium: 4.8 mmol/L (ref 3.5–5.3)
Sodium: 141 mmol/L (ref 135–146)
Total Bilirubin: 0.7 mg/dL (ref 0.2–1.2)
Total Protein: 7.1 g/dL (ref 6.1–8.1)

## 2019-09-20 LAB — CBC
HCT: 44.1 % (ref 35.0–45.0)
Hemoglobin: 14.2 g/dL (ref 11.7–15.5)
MCH: 25.4 pg — ABNORMAL LOW (ref 27.0–33.0)
MCHC: 32.2 g/dL (ref 32.0–36.0)
MCV: 79 fL — ABNORMAL LOW (ref 80.0–100.0)
MPV: 12.1 fL (ref 7.5–12.5)
Platelets: 229 10*3/uL (ref 140–400)
RBC: 5.58 10*6/uL — ABNORMAL HIGH (ref 3.80–5.10)
RDW: 13.1 % (ref 11.0–15.0)
WBC: 5.7 10*3/uL (ref 3.8–10.8)

## 2019-09-20 LAB — TSH: TSH: 8.45 mIU/L — ABNORMAL HIGH (ref 0.40–4.50)

## 2019-09-20 LAB — LIPID PANEL
Cholesterol: 216 mg/dL — ABNORMAL HIGH (ref <200)
HDL: 48 mg/dL — ABNORMAL LOW (ref >=50)
LDL Cholesterol (Calc): 142 mg/dL (calc) — ABNORMAL HIGH
Non-HDL Cholesterol (Calc): 168 mg/dL (calc) — ABNORMAL HIGH (ref <130)
Total CHOL/HDL Ratio: 4.5 (calc) (ref <5.0)
Triglycerides: 135 mg/dL (ref <150)

## 2019-09-20 MED ORDER — LEVOTHYROXINE SODIUM 125 MCG PO TABS: 125.0000 ug | ORAL_TABLET | Freq: Every day | ORAL | 0 refills | Status: DC

## 2019-09-21 ENCOUNTER — Encounter: Payer: Self-pay | Admitting: Osteopathic Medicine

## 2019-09-21 DIAGNOSIS — I10 Essential (primary) hypertension: Secondary | ICD-10-CM

## 2019-09-21 MED ORDER — LOSARTAN POTASSIUM 50 MG PO TABS
50.0000 mg | ORAL_TABLET | Freq: Every day | ORAL | 3 refills | Status: DC
Start: 2019-09-21 — End: 2020-06-29

## 2019-09-21 MED ORDER — IRBESARTAN 150 MG PO TABS: 150.0000 mg | ORAL_TABLET | Freq: Every day | ORAL | 1 refills | Status: DC

## 2019-11-21 ENCOUNTER — Encounter: Payer: Self-pay | Admitting: Osteopathic Medicine

## 2020-02-24 ENCOUNTER — Other Ambulatory Visit: Payer: Self-pay | Admitting: Osteopathic Medicine

## 2020-02-24 DIAGNOSIS — E039 Hypothyroidism, unspecified: Secondary | ICD-10-CM

## 2020-02-24 MED ORDER — LEVOTHYROXINE SODIUM 125 MCG PO TABS: 125.0000 ug | ORAL_TABLET | Freq: Every day | ORAL | 0 refills | Status: DC

## 2020-06-29 ENCOUNTER — Ambulatory Visit (INDEPENDENT_AMBULATORY_CARE_PROVIDER_SITE_OTHER): Payer: Medicare Other | Admitting: Osteopathic Medicine

## 2020-06-29 ENCOUNTER — Encounter: Payer: Self-pay | Admitting: Osteopathic Medicine

## 2020-06-29 ENCOUNTER — Other Ambulatory Visit: Payer: Self-pay

## 2020-06-29 VITALS — BP 155/99 | HR 69 | Temp 97.8°F | Wt 191.1 lb

## 2020-06-29 DIAGNOSIS — Z13220 Encounter for screening for lipoid disorders: Secondary | ICD-10-CM | POA: Diagnosis not present

## 2020-06-29 DIAGNOSIS — E039 Hypothyroidism, unspecified: Secondary | ICD-10-CM | POA: Diagnosis not present

## 2020-06-29 DIAGNOSIS — I10 Essential (primary) hypertension: Secondary | ICD-10-CM | POA: Diagnosis not present

## 2020-06-29 MED ORDER — AMLODIPINE BESYLATE-VALSARTAN 5-160 MG PO TABS: 1.0000 | ORAL_TABLET | Freq: Every day | ORAL | 0 refills | Status: DC

## 2020-06-29 NOTE — Progress Notes (Signed)
HPI: Erica Russell is a 70 y.o. female who  has a past medical history of Hypertension and Thyroid disease.  she presents to [facility] today, 06/29/20,  for chief complaint of:  Concern for high BP   Home BP have been SBP 170's, has double Losartan 50 mg daily to losartan 50 mg bid wthout change.    ASSESSMENT/PLAN: The primary encounter diagnosis was Essential hypertension. Diagnoses of Acquired hypothyroidism and Screening for lipid disorders were also pertinent to this visit.  Orders Placed This Encounter  Procedures  . CBC  . COMPLETE METABOLIC PANEL WITH GFR  . Lipid panel  . TSH     Meds ordered this encounter  Medications  . amLODipine-valsartan (EXFORGE) 5-160 MG tablet    Sig: Take 1 tablet by mouth daily.    Dispense:  90 tablet    Refill:  0    Patient Instructions  Will STOP Losartan Will START Valsartan-Amlodipine  Monitor home BP If >150/90 for several days, increase to 2 pills of the new medication Labs today       Follow-up plan: Return for FOLLOW UP VIA MYCHART MESSAGE (SET TO SEND LATER).                                                 ################################################# ################################################# ################################################# #################################################    Current Meds  Medication Sig  . amLODipine-valsartan (EXFORGE) 5-160 MG tablet Take 1 tablet by mouth daily.  Marland Kitchen aspirin EC 81 MG tablet Take 1 tablet (81 mg total) by mouth daily.  . [DISCONTINUED] losartan (COZAAR) 50 MG tablet Take 1 tablet (50 mg total) by mouth daily.    Allergies  Allergen Reactions  . Hydrochlorothiazide Other (See Comments)    myalgia       Review of Systems: Pertinent (+) and (-) ROS in HPI as above   Exam:  BP (!) 155/99 (BP Location: Left Arm, Patient Position: Sitting, Cuff Size: Large)    Pulse 69   Temp 97.8 F (36.6 C) (Oral)   Wt 191 lb 1.3 oz (86.7 kg)   BMI 29.99 kg/m   Constitutional: VS see above. General Appearance: alert, well-developed, well-nourished, NAD  Neck: No masses, trachea midline.   Respiratory: Normal respiratory effort. no wheeze, no rhonchi, no rales  Cardiovascular: S1/S2 normal, no murmur, no rub/gallop auscultated. RRR.   Musculoskeletal: Gait normal. Symmetric and independent movement of all extremities  Abdominal: non-tender, non-distended, no appreciable organomegaly, neg Murphy's, BS [id number]  Neurological: Normal balance/coordination. No tremor.  Skin: warm, dry, intact.   Psychiatric: Normal judgment/insight. Normal mood and affect. Oriented x3.       Visit summary with medication list and pertinent instructions was printed for patient to review, patient was advised to alert [place] if any updates are needed. All questions at time of visit were answered - patient instructed to contact office with any additional concerns. ER/RTC precautions were reviewed with the patient and understanding verbalized.    Please note: voice recognition software was used to produce this document, and typos may escape review. Please contact Dr. Lyn Hollingshead for any needed clarifications.    Follow up plan: Return for FOLLOW UP VIA MYCHART MESSAGE (SET TO SEND LATER).

## 2020-06-29 NOTE — Patient Instructions (Signed)
Will STOP Losartan Will START Valsartan-Amlodipine  Monitor home BP If >150/90 for several days, increase to 2 pills of the new medication Labs today

## 2020-06-30 LAB — CBC
HCT: 43.2 % (ref 35.0–45.0)
Hemoglobin: 14 g/dL (ref 11.7–15.5)
MCH: 25.3 pg — ABNORMAL LOW (ref 27.0–33.0)
MCHC: 32.4 g/dL (ref 32.0–36.0)
MCV: 78.1 fL — ABNORMAL LOW (ref 80.0–100.0)
MPV: 11.2 fL (ref 7.5–12.5)
Platelets: 271 10*3/uL (ref 140–400)
RBC: 5.53 10*6/uL — ABNORMAL HIGH (ref 3.80–5.10)
RDW: 13.2 % (ref 11.0–15.0)
WBC: 6.4 10*3/uL (ref 3.8–10.8)

## 2020-06-30 LAB — COMPLETE METABOLIC PANEL WITH GFR
AG Ratio: 1.5 (calc) (ref 1.0–2.5)
ALT: 31 U/L — ABNORMAL HIGH (ref 6–29)
AST: 23 U/L (ref 10–35)
Albumin: 4.3 g/dL (ref 3.6–5.1)
Alkaline phosphatase (APISO): 76 U/L (ref 37–153)
BUN: 13 mg/dL (ref 7–25)
CO2: 27 mmol/L (ref 20–32)
Calcium: 9.7 mg/dL (ref 8.6–10.4)
Chloride: 104 mmol/L (ref 98–110)
Creat: 0.94 mg/dL (ref 0.50–0.99)
GFR, Est African American: 72 mL/min/{1.73_m2} (ref >=60)
GFR, Est Non African American: 62 mL/min/{1.73_m2} (ref >=60)
Globulin: 2.8 g/dL (calc) (ref 1.9–3.7)
Glucose, Bld: 98 mg/dL (ref 65–99)
Potassium: 4.5 mmol/L (ref 3.5–5.3)
Sodium: 140 mmol/L (ref 135–146)
Total Bilirubin: 0.5 mg/dL (ref 0.2–1.2)
Total Protein: 7.1 g/dL (ref 6.1–8.1)

## 2020-06-30 LAB — LIPID PANEL
Cholesterol: 191 mg/dL (ref <200)
HDL: 53 mg/dL (ref >=50)
LDL Cholesterol (Calc): 117 mg/dL (calc) — ABNORMAL HIGH
Non-HDL Cholesterol (Calc): 138 mg/dL (calc) — ABNORMAL HIGH (ref <130)
Total CHOL/HDL Ratio: 3.6 (calc) (ref <5.0)
Triglycerides: 103 mg/dL (ref <150)

## 2020-06-30 LAB — TSH: TSH: 0.43 mIU/L (ref 0.40–4.50)

## 2020-07-02 ENCOUNTER — Encounter: Payer: Self-pay | Admitting: Osteopathic Medicine

## 2020-07-18 ENCOUNTER — Other Ambulatory Visit: Payer: Self-pay | Admitting: Osteopathic Medicine

## 2020-07-18 DIAGNOSIS — E039 Hypothyroidism, unspecified: Secondary | ICD-10-CM

## 2020-07-19 MED ORDER — LEVOTHYROXINE SODIUM 125 MCG PO TABS: 125.0000 ug | ORAL_TABLET | Freq: Every day | ORAL | 0 refills | Status: DC

## 2020-08-07 ENCOUNTER — Ambulatory Visit (INDEPENDENT_AMBULATORY_CARE_PROVIDER_SITE_OTHER): Payer: Medicare Other | Admitting: Osteopathic Medicine

## 2020-08-07 ENCOUNTER — Encounter: Payer: Self-pay | Admitting: Osteopathic Medicine

## 2020-08-07 ENCOUNTER — Other Ambulatory Visit: Payer: Self-pay

## 2020-08-07 VITALS — BP 141/81 | HR 79 | Temp 98.0°F | Wt 189.0 lb

## 2020-08-07 DIAGNOSIS — E039 Hypothyroidism, unspecified: Secondary | ICD-10-CM | POA: Diagnosis not present

## 2020-08-07 DIAGNOSIS — I10 Essential (primary) hypertension: Secondary | ICD-10-CM

## 2020-08-07 LAB — BASIC METABOLIC PANEL WITH GFR
BUN: 17 mg/dL (ref 7–25)
CO2: 29 mmol/L (ref 20–32)
Calcium: 9.7 mg/dL (ref 8.6–10.4)
Chloride: 107 mmol/L (ref 98–110)
Creat: 0.99 mg/dL (ref 0.50–0.99)
GFR, Est African American: 68 mL/min/{1.73_m2} (ref >=60)
GFR, Est Non African American: 59 mL/min/{1.73_m2} — ABNORMAL LOW (ref >=60)
Glucose, Bld: 94 mg/dL (ref 65–99)
Potassium: 4.7 mmol/L (ref 3.5–5.3)
Sodium: 142 mmol/L (ref 135–146)

## 2020-08-07 MED ORDER — AMLODIPINE BESYLATE-VALSARTAN 5-160 MG PO TABS: 1.0000 | ORAL_TABLET | Freq: Every day | ORAL | 3 refills | Status: AC

## 2020-08-07 MED ORDER — ASPIRIN EC 81 MG PO TBEC: 81.0000 mg | DELAYED_RELEASE_TABLET | Freq: Every day | ORAL | 3 refills | Status: AC

## 2020-08-07 MED ORDER — LEVOTHYROXINE SODIUM 125 MCG PO TABS: 125.0000 ug | ORAL_TABLET | Freq: Every day | ORAL | 3 refills | Status: AC

## 2020-08-07 NOTE — Progress Notes (Signed)
Erica Russell is a 70 y.o. female who presents to  [facility] at [facility]  today, 08/07/20, seeking care for the following:   Thyroid dose clarification. Recent TSH borderline low but pt feels normal.   BP: recently changed Rx, BP as below   BP Readings from Last 3 Encounters:  08/07/20 (!) 141/81  06/29/20 (!) 155/99  09/19/19 122/82        ASSESSMENT & PLAN with other pertinent findings:  The primary encounter diagnosis was Essential hypertension. Diagnoses of Acquired hypothyroidism and Hypertension goal BP (blood pressure) < 130/80 were also pertinent to this visit.    There are no Patient Instructions on file for this visit.  Orders Placed This Encounter  Procedures   BASIC METABOLIC PANEL WITH GFR    Meds ordered this encounter  Medications   levothyroxine (SYNTHROID) 125 MCG tablet    Sig: Take 1 tablet (125 mcg total) by mouth daily before breakfast.    Dispense:  90 tablet    Refill:  3   amLODipine-valsartan (EXFORGE) 5-160 MG tablet    Sig: Take 1 tablet by mouth daily.    Dispense:  90 tablet    Refill:  3   aspirin EC 81 MG tablet    Sig: Take 1 tablet (81 mg total) by mouth daily.    Dispense:  90 tablet    Refill:  3     See below for relevant physical exam findings  See below for recent lab and imaging results reviewed  Medications, allergies, PMH, PSH, SocH, FamH reviewed below    Follow-up instructions: Return in about 1 year (around 08/07/2021) for MEDICARE WELLNESS W/ RN, ANNUAL W/ DR Lyn Hollingshead AFTER THAT .                                        Exam:  BP (!) 141/81 (BP Location: Left Arm, Patient Position: Sitting, Cuff Size: Normal)    Pulse 79    Temp 98 F (36.7 C) (Oral)    Wt 189 lb 0.6 oz (85.7 kg)    BMI 29.67 kg/m   Constitutional: VS see above. General Appearance: alert, well-developed, well-nourished, NAD  Neck: No masses, trachea  midline.   Respiratory: Normal respiratory effort. no wheeze, no rhonchi, no rales  Cardiovascular: S1/S2 normal, no murmur, no rub/gallop auscultated. RRR.   Musculoskeletal: Gait normal. Symmetric and independent movement of all extremities  Neurological: Normal balance/coordination. No tremor.  Skin: warm, dry, intact.   Psychiatric: Normal judgment/insight. Normal mood and affect. Oriented x3.   Current Meds  Medication Sig   [DISCONTINUED] amLODipine-valsartan (EXFORGE) 5-160 MG tablet Take 1 tablet by mouth daily.   [DISCONTINUED] aspirin EC 81 MG tablet Take 1 tablet (81 mg total) by mouth daily.   [DISCONTINUED] levothyroxine (SYNTHROID) 125 MCG tablet Take 1 tablet (125 mcg total) by mouth daily before breakfast.    Allergies  Allergen Reactions   Hydrochlorothiazide Other (See Comments)    myalgia    Patient Active Problem List   Diagnosis Date Noted   Calcification of left breast on mammography 01/28/2019   Borderline high serum cholesterol 01/17/2019   Statin declined 01/17/2019   Renal insufficiency 10/12/2018   Elevated serum creatinine 07/12/2018   Decreased GFR 07/12/2018   Hypertension goal BP (blood pressure) < 130/80 07/12/2018   Abnormal screening mammogram 07/12/2018   Acquired hypothyroidism  07/12/2018   Colon cancer screening 07/12/2018    Family History  Problem Relation Age of Onset   Hypertension Mother    Dementia Mother    Hypertension Father    Breast cancer Sister        over 95   Breast cancer Sister        over 24, hormone related    Social History   Tobacco Use  Smoking Status Former Smoker   Packs/day: 1.00   Years: 1.00   Pack years: 1.00   Types: Cigarettes   Quit date: 06/12/1983   Years since quitting: 37.1  Smokeless Tobacco Never Used    Past Surgical History:  Procedure Laterality Date   APPENDECTOMY     BREAST CYST ASPIRATION     TONSILLECTOMY      Immunization History   Administered Date(s) Administered   PFIZER(Purple Top)SARS-COV-2 Vaccination 06/16/2019, 07/07/2019, 03/04/2020    Recent Results (from the past 2160 hour(s))  CBC     Status: Abnormal   Collection Time: 06/29/20 10:38 AM  Result Value Ref Range   WBC 6.4 3.8 - 10.8 Thousand/uL   RBC 5.53 (H) 3.80 - 5.10 Million/uL   Hemoglobin 14.0 11.7 - 15.5 g/dL   HCT 43.3 29.5 - 18.8 %   MCV 78.1 (L) 80.0 - 100.0 fL   MCH 25.3 (L) 27.0 - 33.0 pg   MCHC 32.4 32.0 - 36.0 g/dL   RDW 41.6 60.6 - 30.1 %   Platelets 271 140 - 400 Thousand/uL   MPV 11.2 7.5 - 12.5 fL  COMPLETE METABOLIC PANEL WITH GFR     Status: Abnormal   Collection Time: 06/29/20 10:38 AM  Result Value Ref Range   Glucose, Bld 98 65 - 99 mg/dL    Comment: .            Fasting reference interval .    BUN 13 7 - 25 mg/dL   Creat 6.01 0.93 - 2.35 mg/dL    Comment: For patients >42 years of age, the reference limit for Creatinine is approximately 13% higher for people identified as African-American. .    GFR, Est Non African American 62 > OR = 60 mL/min/1.69m2   GFR, Est African American 72 > OR = 60 mL/min/1.61m2   BUN/Creatinine Ratio NOT APPLICABLE 6 - 22 (calc)   Sodium 140 135 - 146 mmol/L   Potassium 4.5 3.5 - 5.3 mmol/L   Chloride 104 98 - 110 mmol/L   CO2 27 20 - 32 mmol/L   Calcium 9.7 8.6 - 10.4 mg/dL   Total Protein 7.1 6.1 - 8.1 g/dL   Albumin 4.3 3.6 - 5.1 g/dL   Globulin 2.8 1.9 - 3.7 g/dL (calc)   AG Ratio 1.5 1.0 - 2.5 (calc)   Total Bilirubin 0.5 0.2 - 1.2 mg/dL   Alkaline phosphatase (APISO) 76 37 - 153 U/L   AST 23 10 - 35 U/L   ALT 31 (H) 6 - 29 U/L  Lipid panel     Status: Abnormal   Collection Time: 06/29/20 10:38 AM  Result Value Ref Range   Cholesterol 191 <200 mg/dL   HDL 53 > OR = 50 mg/dL   Triglycerides 573 <220 mg/dL   LDL Cholesterol (Calc) 117 (H) mg/dL (calc)    Comment: Reference range: <100 . Desirable range <100 mg/dL for primary prevention;   <70 mg/dL for patients with  CHD or diabetic patients  with > or = 2 CHD risk factors. Marland Kitchen LDL-C is  now calculated using the Martin-Hopkins  calculation, which is a validated novel method providing  better accuracy than the Friedewald equation in the  estimation of LDL-C.  Horald Pollen et al. Lenox Ahr. 1610;960(45): 2061-2068  (http://education.[facility].com/faq/FAQ164)    Total CHOL/HDL Ratio 3.6 <5.0 (calc)   Non-HDL Cholesterol (Calc) 138 (H) <130 mg/dL (calc)    Comment: For patients with diabetes plus 1 major ASCVD risk  factor, treating to a non-HDL-C goal of <100 mg/dL  (LDL-C of <40 mg/dL) is considered a therapeutic  option.   TSH     Status: None   Collection Time: 06/29/20 10:38 AM  Result Value Ref Range   TSH 0.43 0.40 - 4.50 mIU/L  BASIC METABOLIC PANEL WITH GFR     Status: Abnormal   Collection Time: 08/07/20 12:00 AM  Result Value Ref Range   Glucose, Bld 94 65 - 99 mg/dL    Comment: .            Fasting reference interval .    BUN 17 7 - 25 mg/dL   Creat 9.81 1.91 - 4.78 mg/dL    Comment: For patients >74 years of age, the reference limit for Creatinine is approximately 13% higher for people identified as African-American. .    GFR, Est Non African American 59 (L) > OR = 60 mL/min/1.96m2   GFR, Est African American 68 > OR = 60 mL/min/1.82m2   BUN/Creatinine Ratio NOT APPLICABLE 6 - 22 (calc)   Sodium 142 135 - 146 mmol/L   Potassium 4.7 3.5 - 5.3 mmol/L   Chloride 107 98 - 110 mmol/L   CO2 29 20 - 32 mmol/L   Calcium 9.7 8.6 - 10.4 mg/dL    No results found.     All questions at time of visit were answered - patient instructed to contact office with any additional concerns or updates. ER/RTC precautions were reviewed with the patient as applicable.   Please note: manual typing as well as voice recognition software may have been used to produce this document - typos may escape review. Please contact Dr. Lyn Hollingshead for any needed clarifications.

## 2020-08-10 ENCOUNTER — Ambulatory Visit: Payer: Medicare Other | Admitting: Osteopathic Medicine

## 2021-01-23 ENCOUNTER — Encounter: Payer: Self-pay | Admitting: Family Medicine

## 2021-02-11 ENCOUNTER — Ambulatory Visit: Payer: Medicare Other | Admitting: Osteopathic Medicine

## 2021-03-03 IMAGING — MG DIGITAL SCREENING BILATERAL MAMMOGRAM WITH TOMO AND CAD
8 series · 8 of 24 positions shown · non-contrast
Comparison: Previous exam(s).

CLINICAL DATA: Screening.

EXAM:
DIGITAL SCREENING BILATERAL MAMMOGRAM WITH TOMO AND CAD

[L CC synth-2D]
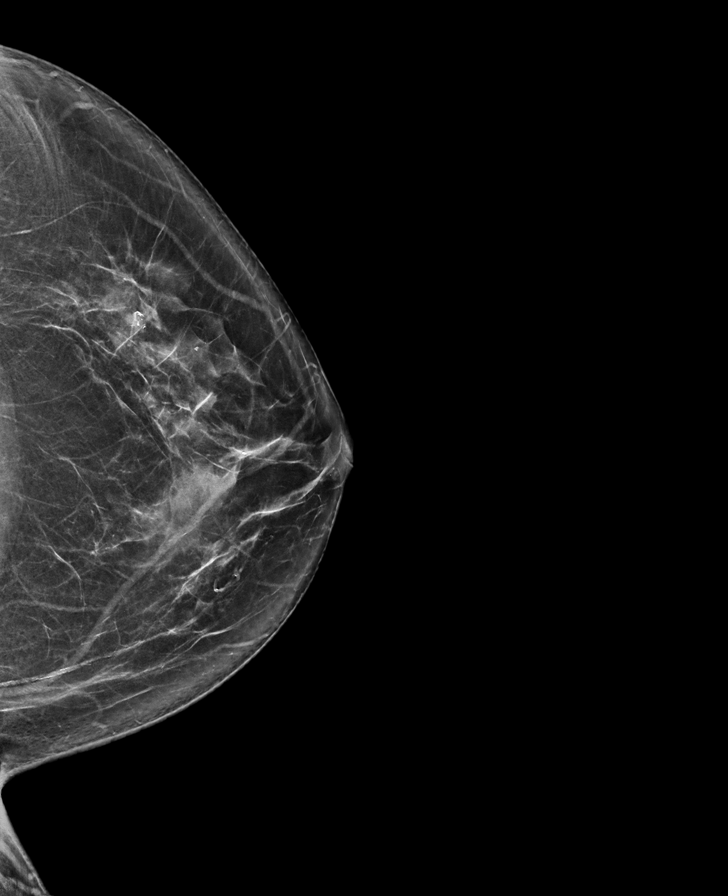

[L MLO synth-2D]
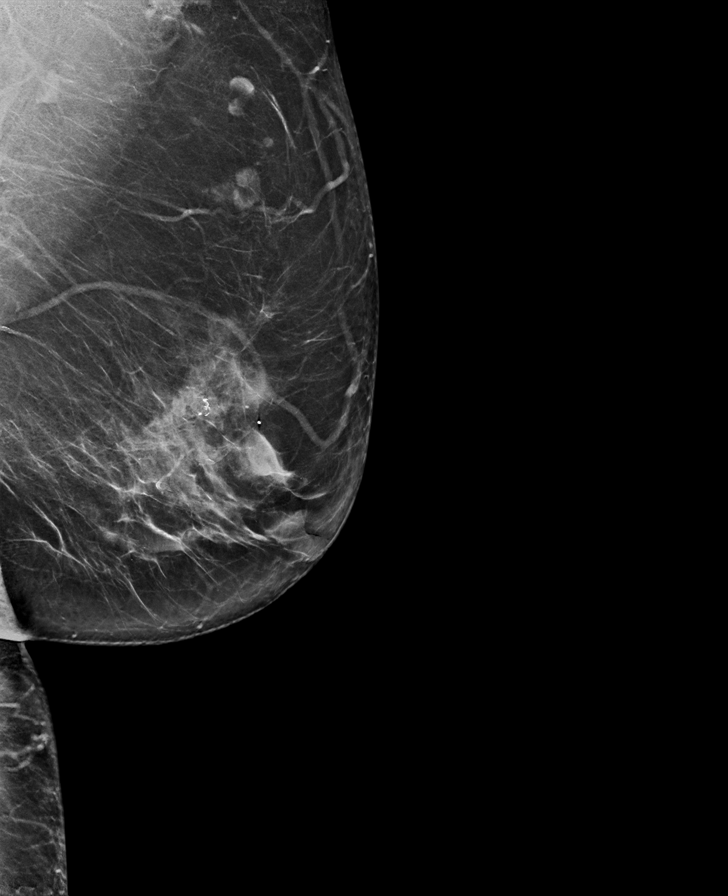

[R CC synth-2D]
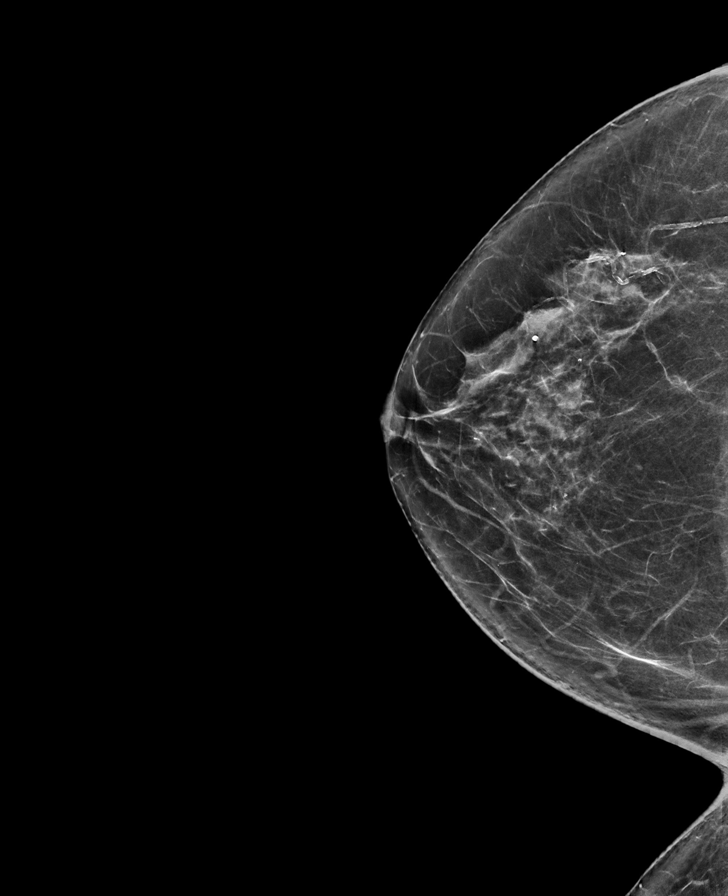

[R MLO synth-2D]
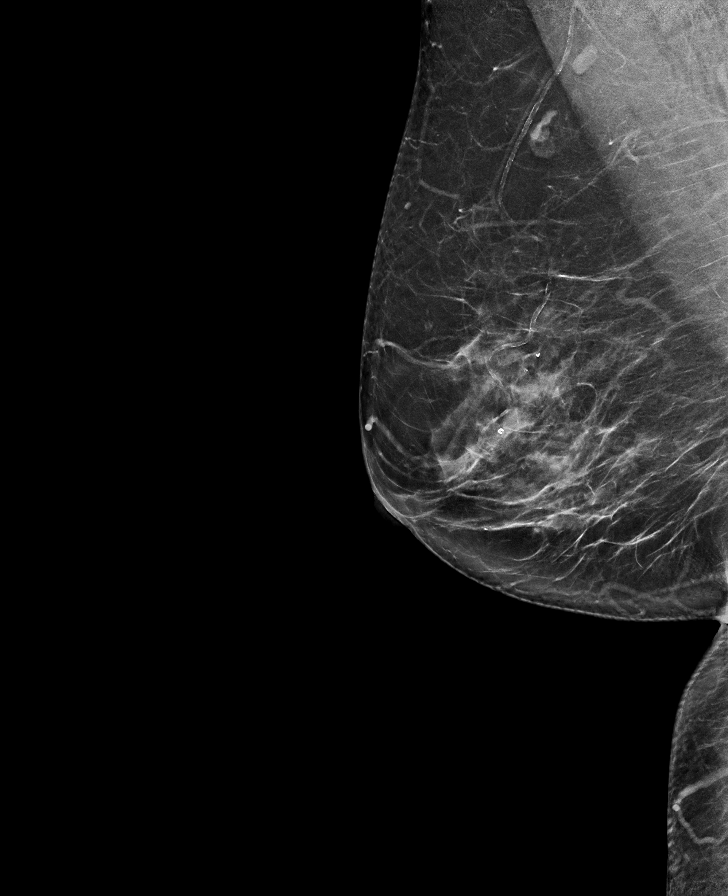

[R CC tomo · tomo slice 35/70.0]
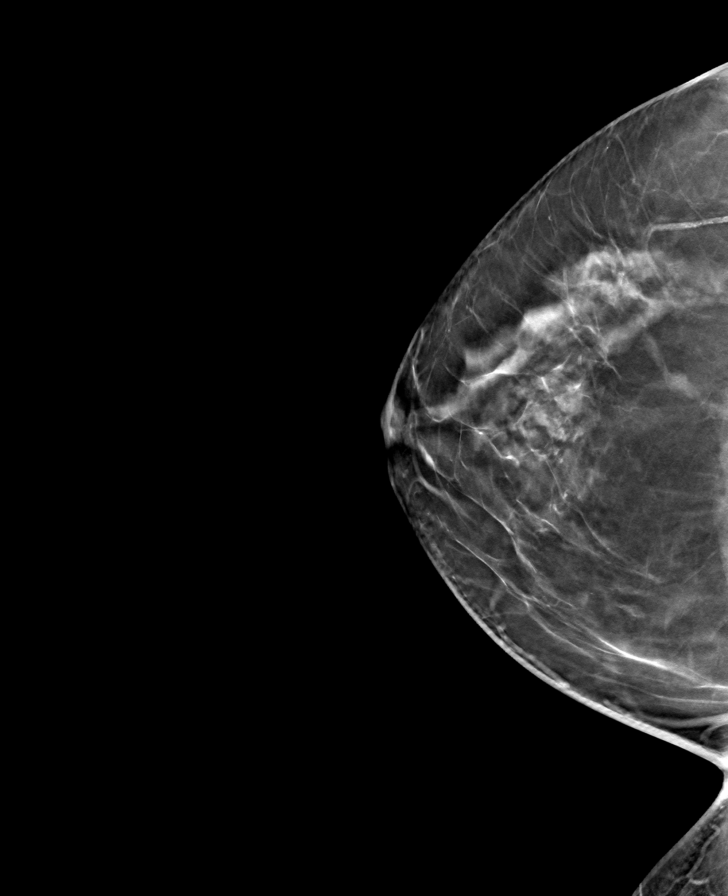

[L CC tomo · tomo slice 36/71.0]
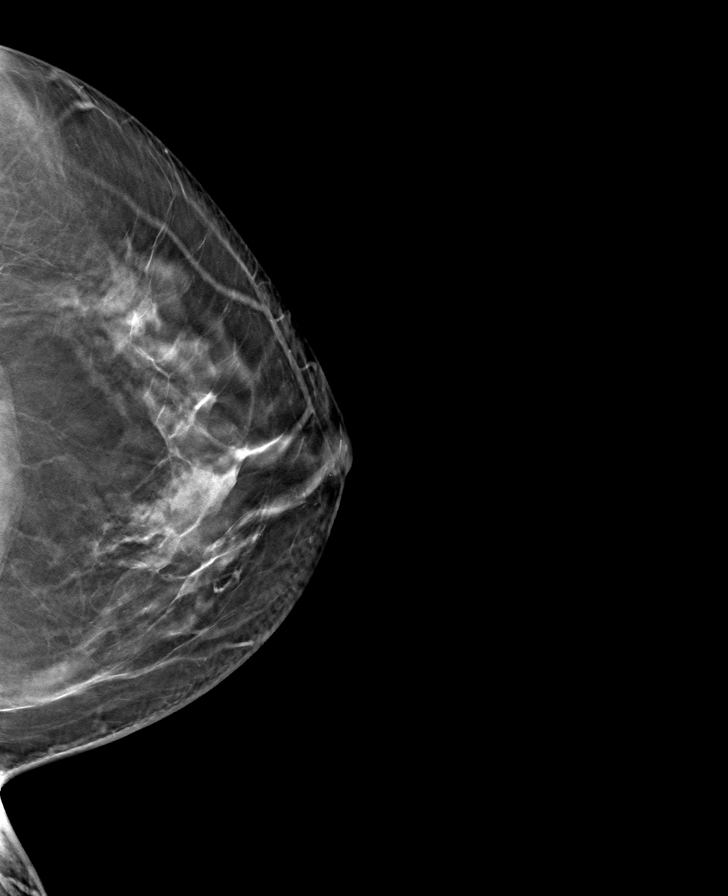

[R MLO tomo · tomo slice 39/76.0]
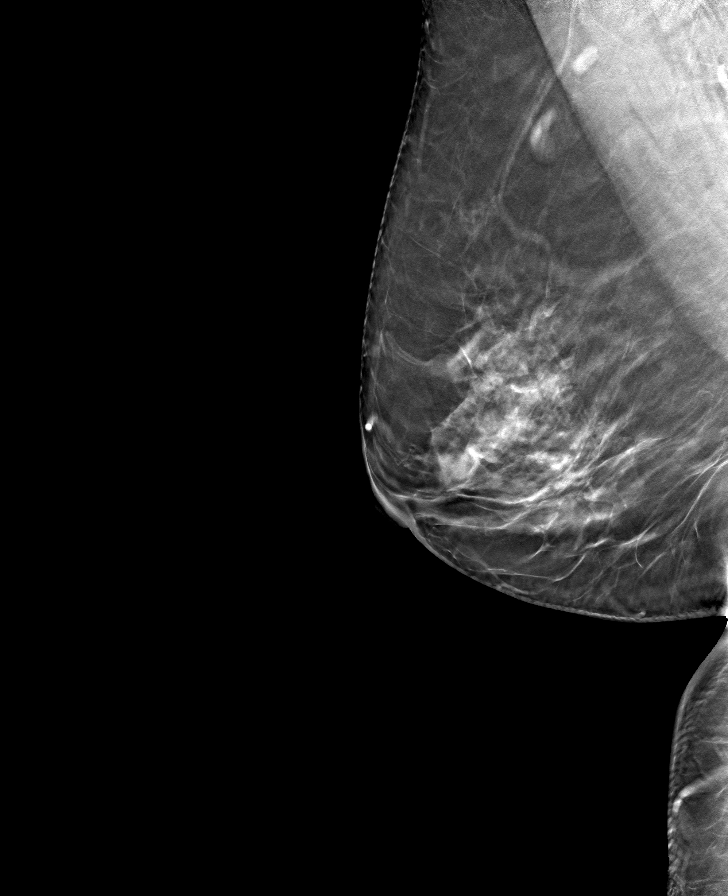

[L MLO tomo · tomo slice 41/81.0]
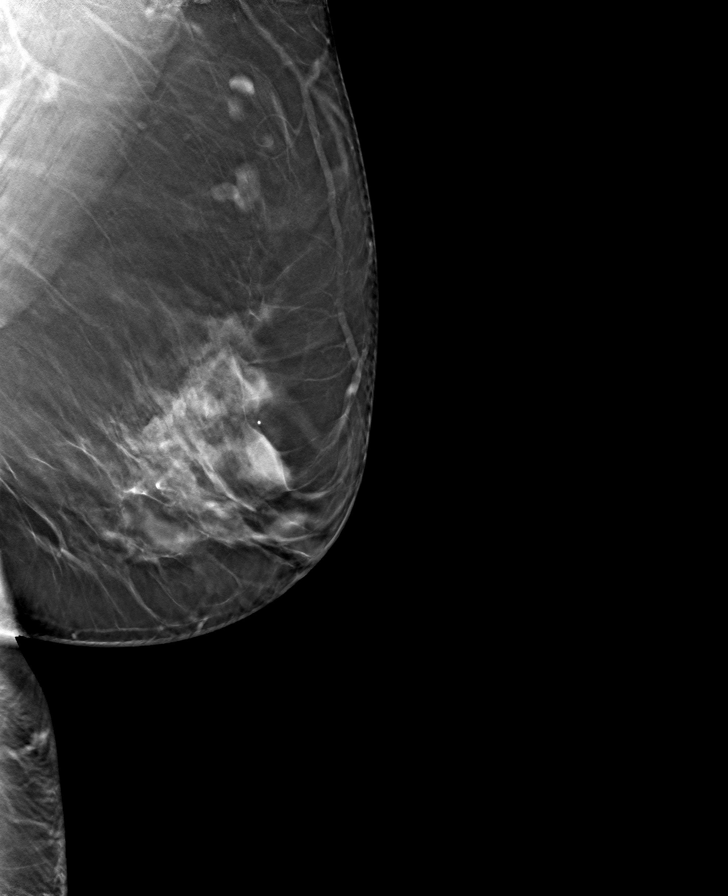

[8 of 24 positions shown; findings below may reference images not displayed]

ACR Breast Density Category c: The breast tissue is heterogeneously
dense, which may obscure small masses.
FINDINGS: In the left breast, calcifications warrant further evaluation. In
the right breast, no findings suspicious for malignancy. Images were
processed with CAD.
IMPRESSION: Further evaluation is suggested for calcifications in the left
breast.

RECOMMENDATION:
Diagnostic mammogram of the left breast. (Code:[id number])

The patient will be contacted regarding the findings, and additional
imaging will be scheduled.

BI-RADS CATEGORY  0: Incomplete. Need additional imaging evaluation
and/or prior mammograms for comparison.

## 2021-03-19 IMAGING — MG MM DIGITAL DIAGNOSTIC UNILAT*L*
2 series · 2 of 2 positions shown · non-contrast
Comparison: Previous exam(s).

CLINICAL DATA: Screening recall for left breast calcifications.

EXAM:
DIGITAL DIAGNOSTIC LEFT MAMMOGRAM WITH CAD

[L ML]
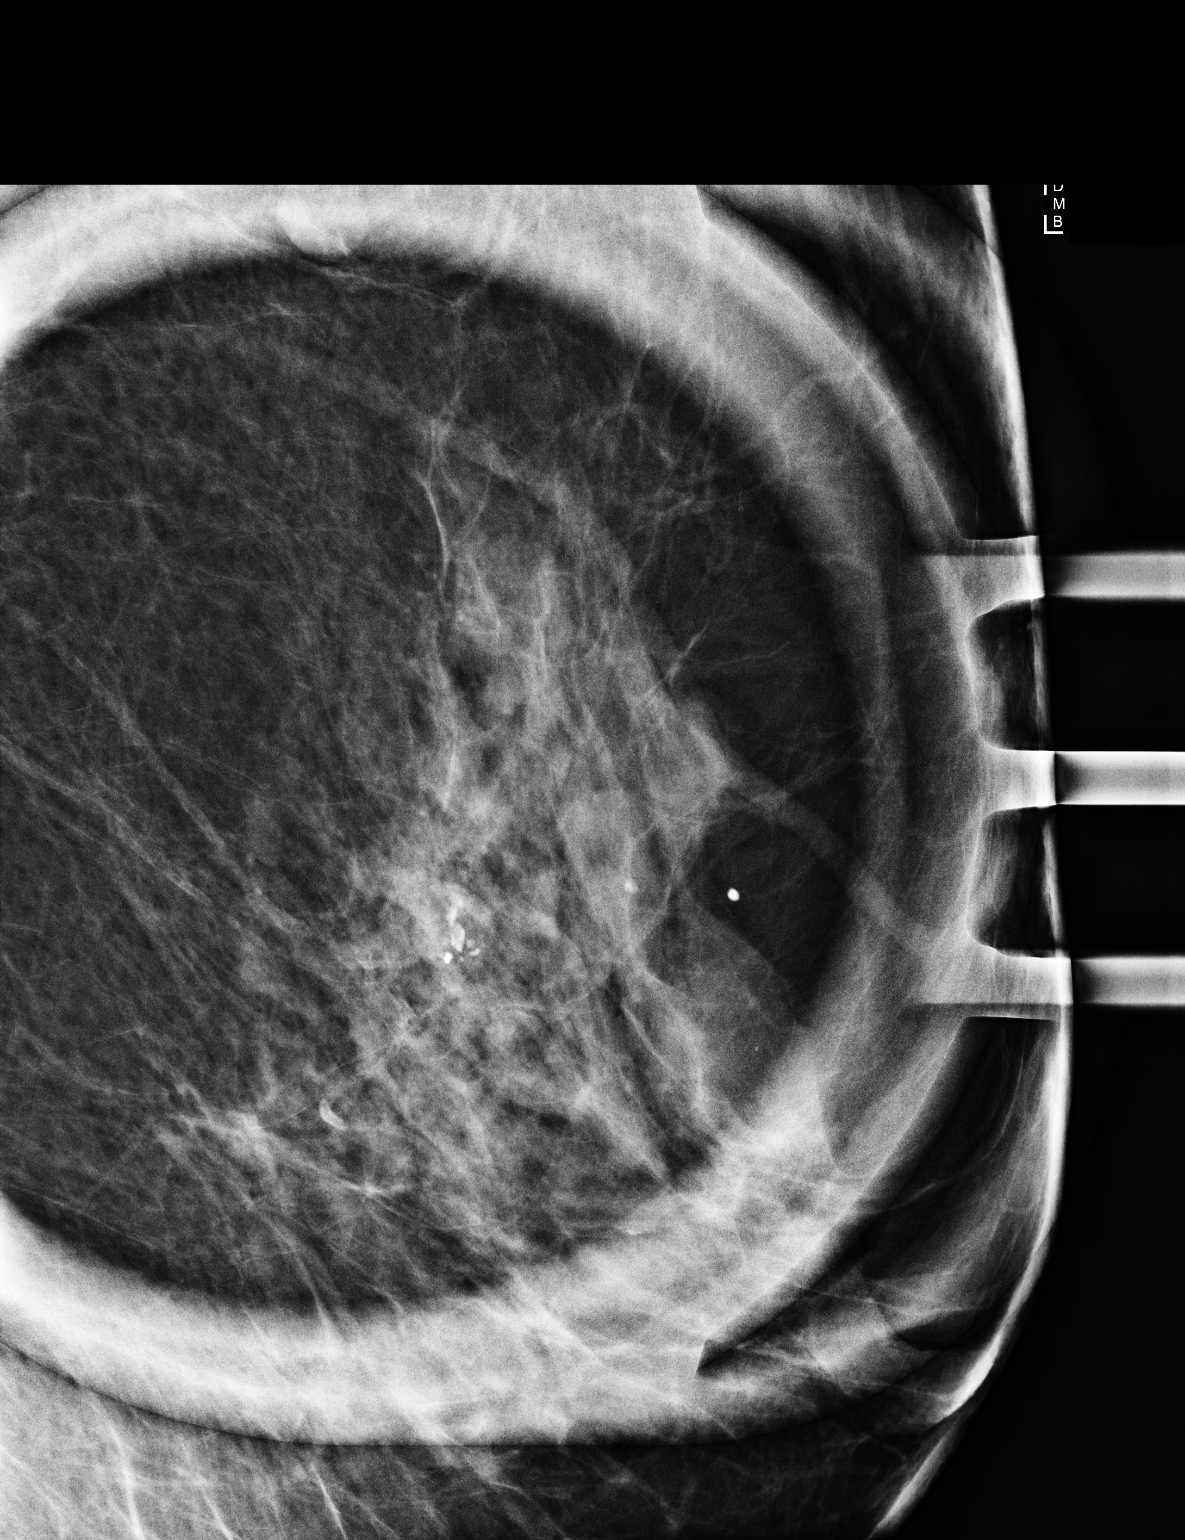

[L CC]
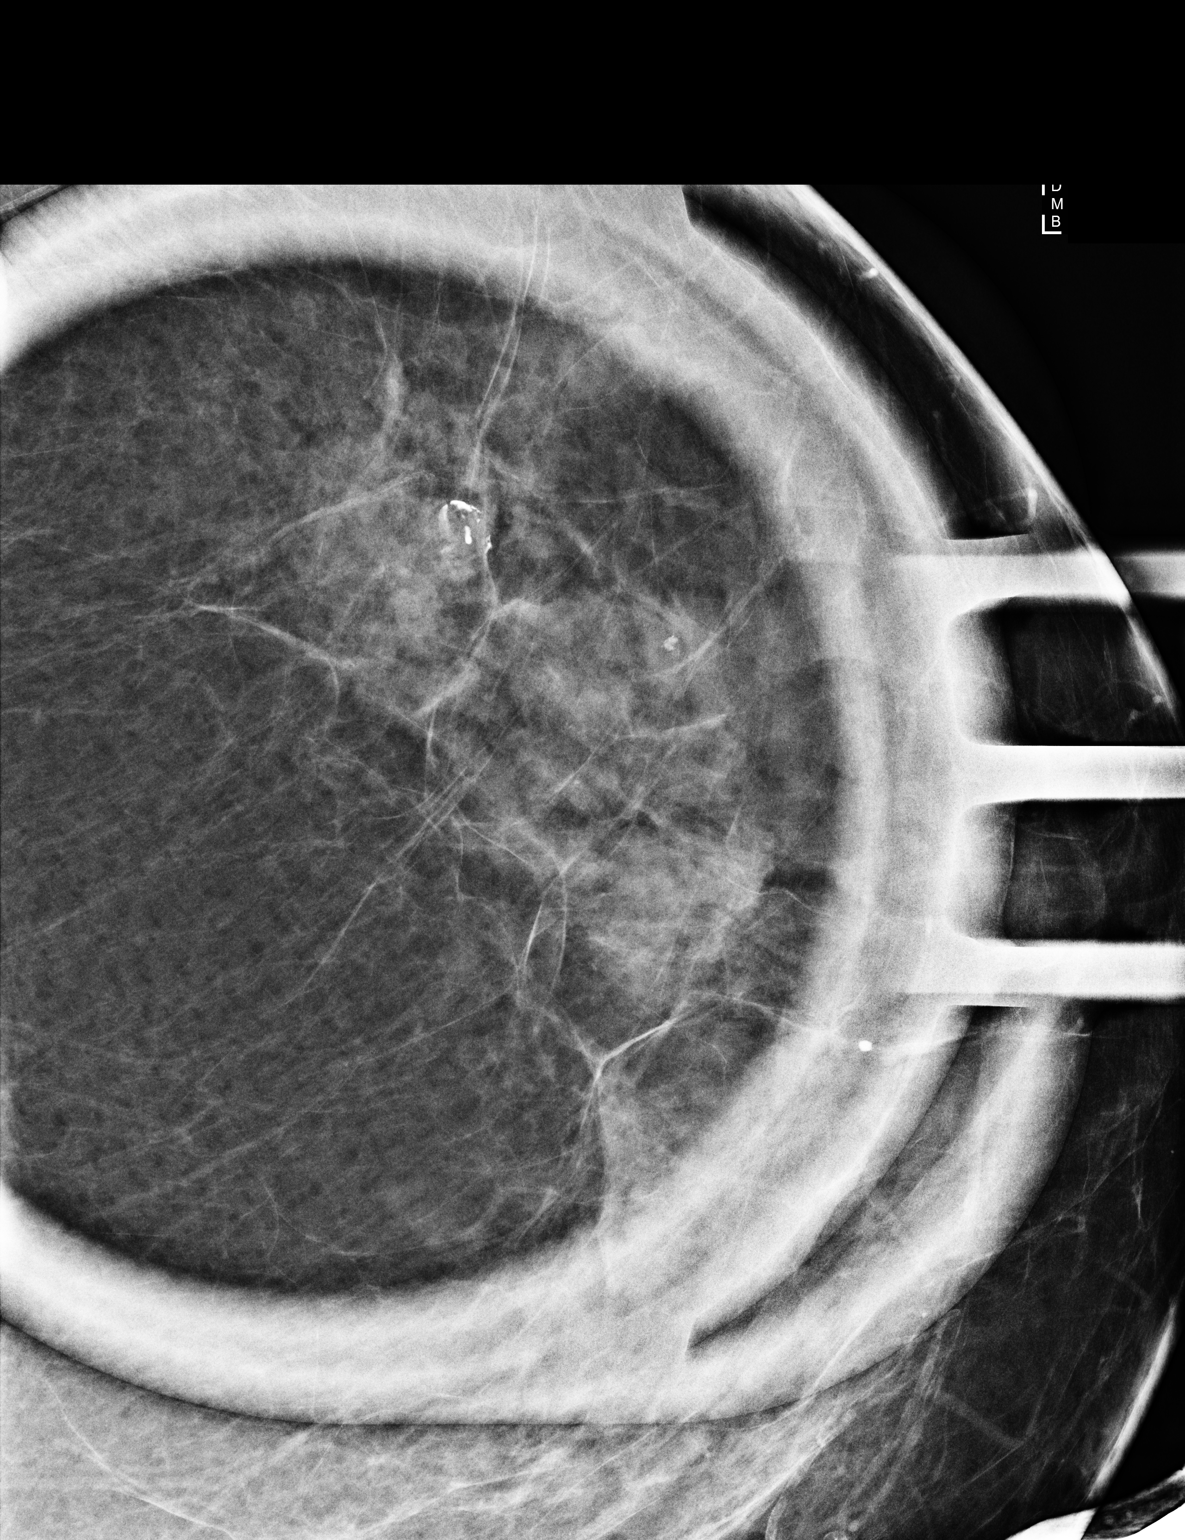

[2 of 2 positions shown; findings below may reference images not displayed]

ACR Breast Density Category c: The breast tissue is heterogeneously
dense, which may obscure small masses.
FINDINGS: In the lateral aspect of the left breast, middle depth there is a 6
mm group of coarse heterogeneous calcifications which are benign
appearing rim calcifications.

Mammographic images were processed with CAD.
IMPRESSION: The 6 mm group of calcifications in the upper-outer left breast are
benign.

RECOMMENDATION:
Screening mammogram in one year.(Code:[id number])

I have discussed the findings and recommendations with the patient.
If applicable, a reminder letter will be sent to the patient
regarding the next appointment.

BI-RADS CATEGORY  2: Benign.

## 2021-06-04 ENCOUNTER — Telehealth: Payer: Self-pay

## 2021-06-04 NOTE — Telephone Encounter (Signed)
Task completed. Pharmacy informed of provider's note via fax. Confirmation rec'd.

## 2021-06-04 NOTE — Telephone Encounter (Signed)
Routing to covering provider and PA coordinator:  Per [facility] pharmacy - "Change in brand for Levothyroxine med, needs authorization please".   Attempted to contact the pharmacy, left a msg on provider's line for clarification.

## 2021-06-05 ENCOUNTER — Other Ambulatory Visit: Payer: Self-pay

## 2021-06-05 DIAGNOSIS — E039 Hypothyroidism, unspecified: Secondary | ICD-10-CM

## 2021-06-05 NOTE — Progress Notes (Signed)
Thyroid testing ordered for patient to complete in 6 weeks.

## 2021-09-02 ENCOUNTER — Other Ambulatory Visit: Payer: Self-pay | Admitting: Osteopathic Medicine

## 2021-09-02 DIAGNOSIS — E039 Hypothyroidism, unspecified: Secondary | ICD-10-CM
